# Patient Record
Sex: Female | Born: 1975 | Race: Black or African American | Hispanic: No | Marital: Married | Smoking: Never smoker
Health system: Southern US, Community
[De-identification: ages and names within clinical notes are randomized; demographics above are authoritative.]

## PROBLEM LIST (undated history)

## (undated) DIAGNOSIS — Z8742 Personal history of other diseases of the female genital tract: Secondary | ICD-10-CM

## (undated) DIAGNOSIS — B009 Herpesviral infection, unspecified: Secondary | ICD-10-CM

## (undated) DIAGNOSIS — K56609 Unspecified intestinal obstruction, unspecified as to partial versus complete obstruction: Secondary | ICD-10-CM

## (undated) DIAGNOSIS — D369 Benign neoplasm, unspecified site: Secondary | ICD-10-CM

## (undated) HISTORY — DX: Benign neoplasm, unspecified site: D36.9

## (undated) HISTORY — PX: OOPHORECTOMY: SHX86

## (undated) HISTORY — DX: Personal history of other diseases of the female genital tract: Z87.42

## (undated) HISTORY — PX: OVARIAN CYST SURGERY: SHX726

## (undated) HISTORY — DX: Unspecified intestinal obstruction, unspecified as to partial versus complete obstruction: K56.609

---

## 2001-03-08 ENCOUNTER — Other Ambulatory Visit: Admission: RE | Admit: 2001-03-08 | Discharge: 2001-03-08 | Payer: Self-pay | Admitting: *Deleted

## 2001-09-24 ENCOUNTER — Encounter: Admission: RE | Admit: 2001-09-24 | Discharge: 2001-09-24 | Payer: Self-pay | Admitting: Nephrology

## 2001-09-24 ENCOUNTER — Encounter: Payer: Self-pay | Admitting: Nephrology

## 2001-09-26 ENCOUNTER — Encounter: Admission: RE | Admit: 2001-09-26 | Discharge: 2001-09-26 | Payer: Self-pay | Admitting: Nephrology

## 2001-09-26 ENCOUNTER — Encounter: Payer: Self-pay | Admitting: Nephrology

## 2001-10-03 ENCOUNTER — Other Ambulatory Visit: Admission: RE | Admit: 2001-10-03 | Discharge: 2001-10-03 | Payer: Self-pay | Admitting: Gynecology

## 2001-10-30 ENCOUNTER — Encounter: Payer: Self-pay | Admitting: Gynecology

## 2001-11-06 ENCOUNTER — Inpatient Hospital Stay (HOSPITAL_COMMUNITY): Admission: RE | Admit: 2001-11-06 | Discharge: 2001-11-11 | Payer: Self-pay | Admitting: Gynecology

## 2001-11-06 ENCOUNTER — Encounter (INDEPENDENT_AMBULATORY_CARE_PROVIDER_SITE_OTHER): Payer: Self-pay | Admitting: *Deleted

## 2001-11-09 ENCOUNTER — Encounter: Payer: Self-pay | Admitting: Gynecology

## 2001-11-10 ENCOUNTER — Encounter: Payer: Self-pay | Admitting: Gynecology

## 2003-01-17 ENCOUNTER — Encounter: Payer: Self-pay | Admitting: Internal Medicine

## 2003-01-17 ENCOUNTER — Encounter: Admission: RE | Admit: 2003-01-17 | Discharge: 2003-01-17 | Payer: Self-pay | Admitting: Internal Medicine

## 2003-10-30 ENCOUNTER — Encounter: Admission: RE | Admit: 2003-10-30 | Discharge: 2003-12-30 | Payer: Self-pay | Admitting: Occupational Medicine

## 2004-12-28 ENCOUNTER — Ambulatory Visit (HOSPITAL_COMMUNITY): Admission: RE | Admit: 2004-12-28 | Discharge: 2004-12-28 | Payer: Self-pay | Admitting: Obstetrics and Gynecology

## 2005-04-15 ENCOUNTER — Ambulatory Visit: Payer: Self-pay | Admitting: Cardiology

## 2005-05-10 ENCOUNTER — Ambulatory Visit: Payer: Self-pay

## 2005-05-15 ENCOUNTER — Emergency Department (HOSPITAL_COMMUNITY): Admission: EM | Admit: 2005-05-15 | Discharge: 2005-05-16 | Payer: Self-pay | Admitting: *Deleted

## 2005-06-09 ENCOUNTER — Inpatient Hospital Stay (HOSPITAL_COMMUNITY): Admission: AD | Admit: 2005-06-09 | Discharge: 2005-06-11 | Payer: Self-pay | Admitting: Obstetrics and Gynecology

## 2005-06-23 ENCOUNTER — Inpatient Hospital Stay (HOSPITAL_COMMUNITY): Admission: AD | Admit: 2005-06-23 | Discharge: 2005-06-26 | Payer: Self-pay | Admitting: Obstetrics and Gynecology

## 2005-07-28 ENCOUNTER — Ambulatory Visit (HOSPITAL_COMMUNITY): Admission: RE | Admit: 2005-07-28 | Discharge: 2005-07-28 | Payer: Self-pay | Admitting: Obstetrics and Gynecology

## 2007-03-23 ENCOUNTER — Other Ambulatory Visit: Admission: RE | Admit: 2007-03-23 | Discharge: 2007-03-23 | Payer: Self-pay | Admitting: Internal Medicine

## 2008-10-17 ENCOUNTER — Emergency Department (HOSPITAL_BASED_OUTPATIENT_CLINIC_OR_DEPARTMENT_OTHER): Admission: EM | Admit: 2008-10-17 | Discharge: 2008-10-17 | Payer: Self-pay | Admitting: Emergency Medicine

## 2010-12-01 LAB — CBC
HCT: 38.6 % (ref 36.0–46.0)
Hemoglobin: 12.7 g/dL (ref 12.0–15.0)
MCH: 27.7 pg (ref 26.0–34.0)
MCHC: 32.9 g/dL (ref 30.0–36.0)
MCV: 84.1 fL (ref 78.0–100.0)
Platelets: 253 10*3/uL (ref 150–400)
RBC: 4.59 MIL/uL (ref 3.87–5.11)
RDW: 13.5 % (ref 11.5–15.5)
WBC: 5.6 10*3/uL (ref 4.0–10.5)

## 2010-12-08 ENCOUNTER — Ambulatory Visit (HOSPITAL_COMMUNITY)
Admission: RE | Admit: 2010-12-08 | Discharge: 2010-12-08 | Payer: Self-pay | Source: Home / Self Care | Attending: Obstetrics and Gynecology | Admitting: Obstetrics and Gynecology

## 2010-12-13 LAB — PREGNANCY, URINE: Preg Test, Ur: NEGATIVE

## 2010-12-23 LAB — SURGICAL PCR SCREEN
MRSA, PCR: NEGATIVE
Staphylococcus aureus: NEGATIVE

## 2010-12-23 NOTE — Op Note (Addendum)
[name], [name]            ACCOUNT NO.:  [id number]  MEDICAL RECORD NO.:  [id number]          PATIENT TYPE:  AMB  LOCATION:  SDC                           [facility]  [facility]  PHYSICIAN:  Hal Morales, M.D.DATE OF BIRTH:  1976/02/28  DATE OF PROCEDURE:  12/08/2010 DATE OF DISCHARGE:  12/08/2010                              OPERATIVE REPORT   PREOPERATIVE DIAGNOSES:  Enlarging right ovarian cyst status post left oophorectomy and right ovarian cystectomy for bilateral dermoids in 2002.  POSTOPERATIVE DIAGNOSES:  Enlarging right ovarian cyst status post left oophorectomy and right ovarian cystectomy for bilateral dermoids in 2002, right ovarian dermoid and right ovarian simple cyst.  PROCEDURE:  Laparoscopic right ovarian cystectomies.  SURGEON:  Miakoda Mcmillion P. Pennie Rushing, MD  FIRST ASSISTANT:  Janine Limbo, MD  ANESTHESIA:  General orotracheal.  ESTIMATED BLOOD LOSS:  Less than 50 mL.  COMPLICATIONS:  None.  FINDINGS:  The uterus was normal-sized.  The patient was status post left salpingo-oophorectomy.  The right ovary contained an 8 cm dermoid cyst with hair and bone and fat cells.  It also contained a 2 cm simple cyst.  The ovary did not have adhesions and the tube on the right side appeared normal.  PROCEDURE:  The patient was taken to the operating room after appropriate identification and placed on the operating table.  After the attainment of adequate general anesthesia, the patient was placed in a modified lithotomy position.  The abdomen, perineum and vagina were prepped with multiple layers of Betadine.  A Foley catheter was inserted into the bladder under sterile conditions and connected to straight drainage.  A Hulka tenaculum was placed on the anterior cervix.  The abdomen was draped as a sterile field.  Subumbilical and suprapubic injections of 0.25% Marcaine for a total of 10 mL was undertaken.  A subumbilical incision was made down to the fascia  which was held and marked with suture and the peritoneum entered.  A Hasson cannula was placed into the peritoneum and anchored with sutures that had been placed through the peritoneum.  The pneumoperitoneum was then created with 4 L of CO2.  The laparoscope was placed in the trocar sleeve. Suprapubic incisions were made to the right and left of midline with a 5- mm trocar being placed in the left incision under direct visualization and a 10-mm trocar being placed in the right incision under direct visualization.  The above-noted findings were made and documented.  The right ovary was elevated and the cortex incised.  Hydrodissection was used to allow separation of the ovarian cyst from the overlying cortex. Once the ovarian cyst had been three-quarters freed from the ovarian cortex, there was a rupture of the cyst with the egress of fatty fluid. The remainder of the cyst was then removed with a combination of blunt and sharp dissection and placed in an EndoCatch bag, then removed through the right suprapubic 10-mm port.  The site of the second cyst was then incised in the cortex and the cortex peeled off the underlying cyst.  That cyst was removed from the ovarian cortex and then from the operative field through the  5 mm port after rupture of the cyst with egress of clear fluid.  Copious irrigation was carried out for a total of 3 L.  A small amount of warm lactated Ringer was left in the pelvis. Hemostasis in the right ovary was noted to be adequate.  All instruments were then removed from the peritoneal cavity under direct visualization as a CO2 was allowed to escape.  The subumbilical incision was closed with the anchoring fascial sutures of 0 Vicryl in a figure-of-eight fashion.  The right suprapubic incision was closed with 2 figure-of- eight sutures.  All of the skin incisions were closed with subcuticular sutures of 3-0 Vicryl.  Sterile dressings were applied, the Hulka tenaculum  removed, and the Foley catheter removed.  The patient was awakened from general anesthesia and taken to the recovery room in satisfactory condition having tolerated the procedure well with sponge and instrument counts correct.  SPECIMENS TO PATHOLOGY:  Two cysts from the right ovary, one a probable dermoid, the second a simple cyst.  DISCHARGE INSTRUCTIONS:  Printed instructions for laparoscopy from the [facility].  FOLLOWUP:  The patient will follow up in 2 weeks with Dr. Pennie Rushing.  DISCHARGE MEDICATIONS: 1. Ibuprofen 600 mg p.o. q.6 h. for 3 days and then q.6 h. p.r.n.     pain. 2. Vicodin 5/500 mg 1-2 p.o. q.4 h. p.r.n. severe pain.     Hal Morales, M.D.     VPH/MEDQ  D:  12/08/2010  T:  12/09/2010  Job:  [id number]  Electronically Signed by Dierdre Forth M.D. on 12/23/2010 03:22:58 PM

## 2010-12-23 NOTE — H&P (Addendum)
NAMEJOLINE, [name]            ACCOUNT NO.:  [id number]  MEDICAL RECORD NO.:  [id number]         PATIENT TYPE:  WAMB  LOCATION:                                [facility]  [facility]  PHYSICIAN:  Hal Morales, M.D.DATE OF BIRTH:  Jun 01, 1976  DATE OF ADMISSION:  12/08/2010 DATE OF DISCHARGE:                             HISTORY & PHYSICAL   DATE OF ADMISSION:  December 08, 2010.  DATE OF EXAMINATION:  November 24, 2010.  HISTORY OF PRESENT ILLNESS:  The patient is a 35 year old black female para 1-0-0-1 who presents for management of an enlarging right ovarian cyst.  The patient was initially seen for this concern in July 2011 at which time she was undergoing her routine gynecologic evaluation for her annual examination but complained of several months of dull right lower quadrant pain, not requiring any medication.  She also complained of positional dyspareunia.  On evaluation, she had an ultrasound which showed a right ovarian simple cyst measuring 6.5 cm in its largest dimension and a right ovarian complex cyst appearing hemorrhagic measuring 2.2 cm in its largest dimension.  The left ovary was surgically removed having been removed in 2001 because of a dermoid cyst.  The uterus was normal sized.  The patient was followed with a repeat ultrasound approximately 6 weeks later showing a thinly septated cyst measuring 8.3 cm in largest dimension and the complex hemorrhagic portion being 2.4 cm.  Neither cyst had increased cul-de-sac fluid.  The patient at that time underwent a CA-125 which was 12.3, i.e., within the normal range.  At this time, she was completely asymptomatic and the decision was made to give the cyst an additional 6 weeks for total of 3 months to decrease in size or resolve.  She returned on November 24, 2010, and at that time was noted to have an ovarian cyst on the right now measuring 9.4 cm in its largest dimension with was thought to be a solid component of 3.3  cm and no cul-de-sac fluid.  This was thought to be possibly consistent with a right dermoid cyst and recommendation was made for removal.  The patient continues to deny any significant abdominal pain.  She likewise denies nausea, vomiting, or diarrhea.  She has had some recent constipation.  She denies dysuria.  She is having regular menstrual periods the last of which was on November 01, 2010, lasting 3 days with no intermenstrual bleeding and no cramps.  She is using condoms for contraception.  PAST MEDICAL HISTORY:  Obstetrical:  The patient had a vaginal delivery in 2006 that was complicated by postpartum preeclampsia requiring rehospitalization for approximately 7 days.  Since that time, she has had normal blood pressures.  SURGICAL HISTORY:  The patient had a laparotomy in 2001 with a left oophorectomy for dermoid cyst.  CURRENT MEDICATIONS:  None.  ALLERGIES:  Drug sensitivities none known.  FAMILY HISTORY:  Positive for hypertension in maternal grandmother and her father and diabetes mellitus type 2 in a maternal grandmother.  SOCIAL HISTORY:  The patient works as an Production designer, theatre/television/film at a [employer].  She is married.  She does not smoke  cigarettes and does not use alcohol.  REVIEW OF SYSTEMS:  Otherwise negative.  PHYSICAL EXAMINATION:  GENERAL:  The patient is a well-developed black female in no acute distress. VITAL SIGNS:  Pulse 68, blood pressure 120/82, weight 176 pounds. LUNGS:  Clear. HEART:  Regular rate rhythm. ABDOMEN:  Soft without masses or organomegaly. PELVIC:  EGBUS within normal limits.  Vagina is rugous.  The cervix is without gross lesions.  The uterus is normal size, shape, consistency, anterior, mobile, and nontender.  On the right, there is a palpable mildly tender right adnexal mass which is cystic in nature. RECTOVAGINAL:  Deferred.  IMPRESSION:  Enlarging right ovarian cyst, probably recurrent dermoid. The patient is status post left  oophorectomy and right ovarian cystectomy for bilateral dermoid cyst in 2001.  DISPOSITION:  Discussion was held with the patient concerning the recommended laparoscopy for right ovarian cystectomy.  She understands that a right oophorectomy could be needed, however, this will be avoided if at all possible.  She also understands that laparotomy could be required.  The risks of anesthesia, bleeding, infection, and damage to adjacent organs have been reviewed and the patient wishes to proceed at [facility] on December 08, 2010.     Hal Morales, M.D.     VPH/MEDQ  D:  12/06/2010  T:  12/07/2010  Job:  [id number]  Electronically Signed by Dierdre Forth M.D. on 12/23/2010 03:20:38 PM

## 2011-04-15 NOTE — H&P (Signed)
NAMEMARLEAH, [name]            ACCOUNT NO.:  [id number]   MEDICAL RECORD NO.:  [id number]          PATIENT TYPE:  INP   LOCATION:  9372                          [facility]ILITY:  [facility]   PHYSICIAN:  Osborn Coho, M.D.   DATE OF BIRTH:  [birth year]   DATE OF ADMISSION:  06/23/2005  DATE OF DISCHARGE:                                HISTORY & PHYSICAL   This is a 35 year old gravida 1, para 1-0-0-1, who is 2 weeks postpartum,  presents for evaluation of headache and hypertension.  She reports having  headache since July 15 and blurred vision since 5 months of pregnancy.  She  has no history of hypertension prior to or during the pregnancy or delivery  time.  Her history is remarkable for (1) History of dermoid cyst.  (2)  History of pyelonephritis.  (3) Keloids.   OBSTETRICAL HISTORY:  The patient had a vaginal delivery on June 09, 2005,  of a female infant, weighing 9 pounds 3 ounces, Apgars 9 and 9, remarkable for  a partial 3rd degree laceration.  There were no complications during or  after delivery.   MEDICAL HISTORY:  1.  Remarkable for a dermoid cyst removed from each ovary.  2.  She has a history of childhood Varicella.  3.  She has a history of acid reflux and pyelonephritis back in the fall.   SURGICAL HISTORY:  Remarkable for removal of dermoid cyst in 2002 from each  ovary, and the patient had an ileus following that surgery.   FAMILY HISTORY:  Remarkable for a grandfather with diabetes and Alzheimer's  and an uncle with an unknown type of psychiatric disease and a mother with  nicotine use.   GENETIC HISTORY:  Remarkable for a cousin with cerebral palsy.   SOCIAL HISTORY:  The patient is married to Zachery Dakins, who is involved  and supportive.  She works as a Engineer, civil (consulting).  She denies any alcohol, tobacco, or  drug use.  She is of the [place] faith.   HISTORY OF CURRENT PREGNANCY:  The patient entered care at [redacted] weeks  gestation.  She had an ultrasound at that time  showing normal ovaries and  intrauterine pregnancy.  She had some spotting at 13 weeks which resolved.  She had an ultrasound at 18 weeks which was normal.  She declined her quad  screen.  She had a Glucola done at 28 weeks which was normal.  She had some  palpitations around that time and saw a cardiologist who deemed her to be  normal.  She measured size greater than dates at 36 weeks, and an ultrasound  was done showing 75-90th percentile.  She had a reactive NST prior to  delivery and then had a vaginal delivery as noted above.   PRENATAL LABS:  Hemoglobin 12.2, platelets 233, blood type A positive,  antibody screen negative, sickle cell negative, RPR nonreactive, rubella  equivocal, hepatitis negative, HIV negative, Pap normal, gonorrhea negative,  Chlamydia negative, cystic fibrosis negative, Glucola normal.   OBJECTIVE DATA:  VITAL SIGNS:  Stable.  Blood pressure 140/102, 154/98,  182/109, 181/113, and 165/90.  HEENT:  Within normal limits with slight facial edema.  Thyroid normal and  not enlarged.  CHEST:  Clear to auscultation.  HEART:  Regular rate and rhythm.  ABDOMEN:  Soft and nontender.  Lochia normal and scant.  EXTREMITIES:  Trace to 1+ edema.   Cath UA shows specific gravity of 1.010 and all other parameters are  negative including protein.  Uric acid 5.6, hemoglobin 12.1, platelets 387,  creatinine 0.9, AST 25, ALT 31, LDH 226.   ASSESSMENT:  1.  Status post spontaneous vaginal delivery 2 weeks ago.  2.  Postpartum pregnancy-induced hypertension, rule out preeclampsia.   PLAN:  1.  Admit to hospital per Dr. Su Hilt.  2.  Labetalol for hypertension control.  3.  Magnesium sulfate administration.  4.  Twenty-four urine for protein and creatinine clearance.  5.  Medical doctor to follow.       MLW/MEDQ  D:  06/23/2005  T:  06/23/2005  Job:  [id number]

## 2011-04-15 NOTE — H&P (Signed)
[name], [name]            ACCOUNT NO.:  [id number]   MEDICAL RECORD NO.:  [id number]          PATIENT TYPE:  INP   LOCATION:  NA                            [facility]   PHYSICIAN:  Hal Morales, M.D.DATE OF BIRTH:  [birth year]   DATE OF ADMISSION:  DATE OF DISCHARGE:                                HISTORY & PHYSICAL   This is a 35 year old gravida 1, para 0-0-0-0, at 41-0/7 weeks, who presents  to the office with complaints of contractions every five to nine minutes for  one hour.  Cervix is 4-5 cm dilated, and she will be going to the hospital  for admission.  She reports positive fetal movement and bloody show and  denies leaking.  Pregnancy has been followed by the nurse midwife service  and remarkable for:   1.  History of dermoid cyst.  2.  Hysterectomy of pyelonephritis.  3.  Keloid scarring.  4.  Group B strep negative.   PAST OBSTETRICAL HISTORY:  The patient is a primigravida.   PAST MEDICAL HISTORY:  Remarkable for a dermoid cyst removed from each  ovary.  She has a history of childhood varicella.  She has a history of acid  reflux and a history of pyelonephritis in the fall.   PAST SURGICAL HISTORY:  Remarkable for removal of dermoid cysts in 2002 from  each ovary.  The patient had an ileus following the surgery.   FAMILY HISTORY:  Remarkable for a grandmother with diabetes and Alzheimer's,  an uncle with unknown type of psychiatric disease, mother with nicotine use.   GENETIC HISTORY:  Remarkable for a cousin with cerebral palsy.   SOCIAL HISTORY:  The patient is married to Zachery Dakins, who is involved  and supportive.  She works as a Engineer, civil (consulting).  She denies any alcohol, tobacco or  drug use.  She is of the [place] faith.   HISTORY OF CURRENT PREGNANCY:  The patient entered care at 23 weeks'  gestation.  She had an ultrasound at that time showing normal ovaries and an  intrauterine pregnancy.  She had some spotting at 13 weeks, which  resolved.  She had an ultrasound at 18 weeks, which was normal.  She declined her quad  screen.  She had a Glucola done at 28 weeks, which was normal.  She had some  palpitations around that time and saw a cardiologist, who deemed her to be  normal.  She measured size greater than dates at 36 weeks and ultrasound was  done, which showed 75-90th percentile growth, AFI of 21.1.  She had a  reactive NST yesterday.  Cervix yesterday was fingertip and 50% and today is  4-5 cm.   PRENATAL LABORATORY DATA:  Hemoglobin 12.2, platelets 233.  Blood type A  positive, antibody screen negative.  Sickle cell negative.  RPR nonreactive.  Rubella equivocal.  Hepatitis negative.  HIV negative.  Pap normal.  Gonorrhea negative, Chlamydia negative.  Cystic fibrosis negative.  Glucola  within normal limits.   OBJECTIVE:  VITAL SIGNS:  Stable, afebrile.  HEENT:  Within normal limits.  NECK:  Thyroid normal, not  enlarged.  CHEST:  Clear to auscultation.  CARDIAC:  Regular rate and rhythm.  ABDOMEN:  Gravid at 40 cm, vertex to Leopold's.  Fetal heart rate 150.  PELVIC:  Cervix is 4-5, 90% effaced, -1 to  station, vertex presentation,  intact membranes.  Positive bloody show.  EXTREMITIES:  Within normal limits.   ASSESSMENT:  1.  Intrauterine pregnancy at 41 weeks.  2.  Early active labor.  3.  Group B strep negative.   PLAN:  1.  Admit to birthing suite, Dr. Pennie Rushing will be notified.  Renaldo Reel Emilee Hero,      C.N.M., has been notified.  2.  Routine C.N.M. orders.  3.  Further orders to follow.       MLW/MEDQ  D:  06/09/2005  T:  06/09/2005  Job:  [id number]

## 2011-04-15 NOTE — Discharge Summary (Signed)
[name], [name]            ACCOUNT NO.:  [id number]   MEDICAL RECORD NO.:  [id number]          PATIENT TYPE:  INP   LOCATION:  9319                          [facility]   PHYSICIAN:  Janine Limbo, M.D.DATE OF BIRTH:  08/21/1976   DATE OF ADMISSION:  06/23/2005  DATE OF DISCHARGE:  06/26/2005                                 DISCHARGE SUMMARY   ADMITTING DIAGNOSES:  1.  Two weeks status post vaginal delivery.  2.  Severe headache.  3.  Pregnancy-induced hypertension.  4.  Rule out preeclampsia.   DISCHARGE DIAGNOSIS:  Postpartum preeclampsia, status post magnesium sulfate  now stable.   HISTORY:  [name] a 34 year old gravida 1, para 1, who presented to the  office of [facility] 2 weeks postpartum for evaluation of headache and  hypertension.   HOSPITAL COURSE:  On admission to [facility], her blood pressures were  140/102, 154/98 to 181/113. She was, therefore, admitted for magnesium  sulfate and 24-hour urine.  Her blood work was normal. CBC showed WBC 7.9,  hemoglobin 12.1, hematocrit 37.2, and platelets 386,000. Uric acid 5.6, AST  25, ALT 31, LDH 226.  Her metabolic panel was within normal limits. She was  begun on Labetalol, and her blood pressures stabilized.  Her 24-hour urine  returned with 360 grams of protein for a diagnosis of preeclampsia. She has  felt much better, and her headache has resolved.  She has been off magnesium  sulfate for 24 hours. Her blood pressures have stabilized, although on the  high side. They have been 154/95, 133/84, 147/93.  Per Dr. Stefano Gaul, she is  in stable condition for discharge. She has been taking labetalol 100  milligrams b.i.d.   DISCHARGE MEDICATIONS:  She will be discharged home today with labetalol 200  milligrams p.o. b.i.d.   FOLLOW UP:  She will be for a blood pressure check this week at home and  follow up in the office as needed. She will call for any PIH symptoms or any  problems or  concerns.       SDM/MEDQ  D:  06/26/2005  T:  06/26/2005  Job:  [id number]

## 2011-04-15 NOTE — Discharge Summary (Signed)
[facility]  Patient:    [name], [name] Visit Number: [id number] MRN: [id number]          Service Type: GYN Location: 4W [id number] [id number] Attending Physician:  Tonye Royalty Dictated by:   Gaetano Hawthorne. Lily Peer, M.D. Admit Date:  11/06/2001 Disc. Date: 11/11/01                             Discharge Summary  HISTORY OF PRESENT ILLNESS:  The patient is a 35 year old, gravida 0, who on the afternoon of November 06, 2001, underwent an exploratory laparotomy,  left salpingo-oophorectomy, and right ovarian cystectomy with no intraoperative complications and the blood loss had been reported to be 200 cc.  HOSPITAL COURSE:  On the patients first postoperative day, her hemoglobin and hematocrit were 11.6 and 37.1, respectively, with a platelet count of 267,000. She had minimal bowel sounds and her incision was intact for the first 24 hours.  Her Foley catheter was removed, as well as her PCP pump and she was started on clear liquids that evening.  On the second postoperative day, there was no flatus reported, her vital signs were stable, and her abdomen appeared mildly distended, questionable ileus versus early small bowel obstruction. The incision was intact.  She was given a Dulcolax suppository and was kept on clear liquids.  She also was given magnesium citrate to take as well.  On the third postoperative day, still no flatus and minimal bowel sounds were noted. She was kept on clear liquids.  A flat and upright of the abdomen was ordered that evening and postoperative ileus was the working diagnosis as reported by the radiologist.  Her electrolytes demonstrated some mild hypokalemia with a potassium of 3.3.  She had an NG tube placed and was given 30 mEq of KCl per liter of D5 LR.  She was kept on the NG tube for 24 hours.  On the evening of November 10, 2001, her flat and upright of the abdomen findings were consistent with a mild postoperative  ileus and the NG tube was removed.  Her electrolytes had returned back to normal.  She was started on clear liquids that night.  She eventually had three bowel movements on the evening of November 11, 2001, tolerated clear liquids, and was ready to be discharged home.  On the day of discharge, her vital signs were stable, she had three bowel movements, had passed flat, tolerated a liquids diet, bowel sounds were present, and the incision site was intact.  PROCEDURES PERFORMED: 1. Exploratory laparotomy. 2. Left salpingo-oophorectomy. 3. Right ovarian cystectomy. 4. Pelvic washings/frozen specimen.  FINAL DIAGNOSES: 1. Left benign ovarian cystic teratoma. 2. Right benign cystic teratoma. 3. Postoperative ileus.  FINAL DISPOSITION:  The patient was discharged home on her fifth postoperative day.  She had tolerated her diet, passed flatus, had severe bowel movements, and had been ambulating.  All instructions were provided to the patient in written and oral form and all questions were answered.  FOLLOW-UP:  She is to return to the office in 48 hours to have her staples removed and for follow-up.  DISCHARGE MEDICATIONS:  She was instructed to stay on Motrin 800 mg t.i.d. for the next several days before taking any narcotics to continue to improve her peristalsis due to the fact that she had postoperative ileus.  She also was given a prescription for Lortab 7.5 mg to take one p.o. q.4-6h. p.r.n.  ACTIVITY:  Instructions as to no lifting or strenuous activity were provided to the patient. Dictated by:   Gaetano Hawthorne Lily Peer, M.D. Attending Physician:  Tonye Royalty DD:  11/11/01 TD:  11/11/01 Job: [id number]

## 2011-04-15 NOTE — H&P (Signed)
[facility]  Patient:    [name], [name] Visit Number: [id number] MRN: [id number]          Service Type: Attending:  Gaetano Hawthorne. Lily Peer, M.D. Dictated by:   Gaetano Hawthorne Lily Peer, M.D. Adm. Date:  11/06/01                           History and Physical  CHIEF COMPLAINT:  Bilateral pelvic masses.  HISTORY OF PRESENT ILLNESS:  The patient is a 35 year old, G0 who was seen in the office for her annual gynecological examination as a new patient on October 13, 2001.  She had brought with her reports of a CT that she had done recently due to the fact that she had been complaining of left leg pain and numbness for several years.  Incidental finding on the CT scan demonstrated pelvic mass on her left ovary measuring 14 x 8.8 cm.  As part of the preop workup, she had a CBC, UA, GC and chlamydia, cholesterol, CA 125, HCG and AST with tumor markers as well as an ultrasound.  The ultrasound in the office demonstrated a displaced uterus by the left adnexal mass.  The uterus measures 7.8 x 3.5 x 5.4 cm with an endometrial stripe of 13.2 mm.  Cystic fluid area was noted in the uterine fundus consistent with gestational sac less than five weeks measuring 6.1 mm.  The right ovary seemed superior to the uterus and measured 4.6 x 2.7 x 5.0 cm with a complex cystic mass with a 14 x 12 mm echogenic focus.  Within the mass, was a cyst measuring 27 x 21 x 25 mm.  The left ovary was not seen.  Transabdominal image done secondary to massive size in the left adnexal mass.  The mass measured 15.3 x 9.5 x 12.7 cm, thin walled with diffuse homogeneous internal low level echocardiograms on transvaginal image.  Mass contained thin septations with layering of echogenic components in calcifications seen within this mass.  No vascular flow was seen within the mass with color flow Doppler.  On transvaginal image, mass appeared to be cystic solid, echogenic pattern with an echogenic  interface between these two echogenic patterns, possibly a dermoid cyst.  The patient subsequently decided to proceed and have an elective termination which she had on Monday, December 2.  Her workup demonstrated a urine that was normal.  Chest x-ray with no evidence of active disease.  Review of the CT scan that she had had on October 30, did not demonstrate any evidence of any pelvic or periaortic lymphadenopathy.  Consensus was possibly a dermoid cyst.  Her tumor markers were otherwise normal.  Her Pap smear recently was normal with the exception of yeast infection.  Her cholesterol was normal.  Her CBC was also normal.  GC and chlamydia culture were also negative.  The patient was seen today, December 6, for preoperative consultation.  PAST MEDICAL HISTORY:  Menarche at the age of 55.  She stated that her last menstrual period had been October 12, which would have placed her approximately 7-1/2 weeks at the time she had her elective termination on December 2.  She had been using condoms as a form of contraception.  She has had one steady partner for three years.  ALLERGIES:  No known drug allergies.  SOCIAL HISTORY:  No smoking or alcohol consumption.  FAMILY HISTORY:  Maternal grandmother is an insulin-dependent diabetic. Father with history of hypertension.  She  has one brother and one sister with normal health.  PHYSICAL EXAMINATION:  GENERAL:  Well-developed, well-nourished female.  HEENT:  Unremarkable.  NECK:  Supple.  Trachea midline.  No carotid bruits, no thyromegaly.  LUNGS:  Clear to auscultation without rhonchi or wheezes.  HEART:  Regular rate and rhythm with no murmurs, rubs or gallops.  BREASTS:  Not done.  ABDOMEN:  Slightly distended from pelvic mass that was palpated on exam.  No rebound or guarding.  PELVIC:  Bartholin, urethral and Skeene glands within normal limits.  Vagina and cervix with no gross lesions on inspection.  Uterus 18-week size  on bimanual exam.  Adnexa difficult to discern secondary to the large uterine mass.  Rectal exam and digital exam confirmatory.  ASSESSMENT:  A 35 year old, G1, P0, AB1, status post dilatation and evacuation with elective termination with incidental finding at time of a computed tomographic scan of abdominal and pelvic region secondary to the patient having had history of left leg pain and numbness.  She was found to have a large 16 cm size left adnexal mass and a right adnexal mass on the right ovary, possibly consistent with bilateral dermoid cyst.  The patient was counseled as to the plan of action while her tumor markers are negative with the exception of slightly elevated CA 125 at 33 and the HCG which could be explained due to the fact that she was recently pregnant.  The approach would be with a midline incision to allow adequate access to this large pelvic mass. Potential risks are that this mass could be malignant which would require extending the incision up to the xiphoid process.  Also, we will have the gynecologic oncologist on standby in the event we need to do a full staging procedure which the patient is aware could include a total abdominal hysterectomy with bilateral salpingo-oophorectomy, pelvic and periaortic lymphadenectomy along with partial omentectomy and this would mean that she would not be able to have anymore children.  Also in the event that these are dermoid cysts, all efforts will be made to perform bilateral cystectomy, but in the event that the left tube and ovary is are severely damaged, she has given full authorization to proceed with a left salpingo-oophorectomy with conservation of the right ovary after right ovarian cystectomy.  Other potential risks are infection, although she will receive prophylaxis antibiotics.  The risk also for a deep venous thrombosis and pulmonary embolism.  Also, there is potential risk for uncontrollable hemorrhage requiring  blood transfusion with potential risk such as anaphylactic reaction, hepatitis and AIDS or as a life saving measure of a total hysterectomy.  Again, the patient would not be able to have anymore children if there were to have to be done only as a life threatening measure.  Also, other risks to include trauma to internal organs with need for corrective surgery.  All these issues were discussed with the patient and husband and all questions were answered.  We will follow accordingly.  PLAN:  The patient is scheduled for exploratory laparotomy with bilateral ovarian cystectomy, possible left oophorectomy, possible left salpingo-oophorectomy with right cystectomy, possible TAH/BSO with staging procedure for malignancy if identified.  The patient is scheduled for surgery on Thursday, December 10, at 2 p.m. at [facility]. Dictated by:   Gaetano Hawthorne. Lily Peer, M.D. Attending:  Gaetano Hawthorne. Lily Peer, M.D. DD:  11/02/01 TD:  11/02/01 Job: [id number]

## 2011-04-15 NOTE — Op Note (Signed)
[facility]  Patient:    [name], [name] Visit Number: [id number] MRN: [id number]          Service Type: GYN Location: 4W 0473 01 Attending Physician:  Tonye Royalty Dictated by:   Gaetano Hawthorne. Lily Peer, M.D. Proc. Date: 11/06/01 Admit Date:  11/06/2001                             Operative Report  SURGEON:  Juan H. Lily Peer, M.D.  ASSISTANT:  Douglass Rivers, M.D.  INDICATIONS:  This is a 35 year old, gravida 1, para 0, AB 1, with bilateral pelvic masses. The patients workup had consisted of a slightly elevated CA-125 at 33, alpha-fetoprotein was normal, hCG had been positive due the fact that she recently had found out that she was pregnancy and had an elective termination approximately ten days ago. Chest x-ray was normal and abdominopelvic CT scan did not demonstrate any periaortic or pelvic lymphadenopathy.  PREOPERATIVE DIAGNOSIS:  Bilateral pelvic masses on left greater than right.  POSTOPERATIVE DIAGNOSIS:  Bilateral dermoid cysts (mature teratomas).  ANESTHESIA:  General endotracheal.  PROCEDURE: 1. Exploratory laparotomy. 2. Left salpingo-oophorectomy. 3. Right ovarian cystectomy.  FINDINGS:  The patient had a 15 x 10-cm left ovarian mass with smooth surface, no excrescences noted, impacted in the cul-de-sac. Right ovary had a 3 x 4-cm multilobulated ovary with ovarian cyst. Normal appearing appendix. There was no evidence of endometriosis or any pelvic adhesions with the exception of a small fibrinous adhesive band from the sigmoid colon to the left adnexal mass.  DESCRIPTION OF PROCEDURE:  After the patient was adequately counseled, she was taken to the operating room where she underwent a successful general endotracheal anesthesia. In an effort to prevent deep venous thrombosis, she had pneumatic compression stockings and to prevent infection intraoperatively, she received a gram of Cefotan IV. After anesthesia  was on board, the patient was in the supine position, the abdomen was prepped and draped in the usual sterile fashion, a Foley catheter was inserted in an effort to monitor urinary output. After the drapes were in placed, a midline incision was made from the infraumbilical region to the level of the symphysis pubis. The incision was carried down from the skin, subcutaneous tissue down to the rectus fascia. A midline nick was made, the fascia was incised in the cephalic and caudal fashion, the midline raphe was entered, and the peritoneal cavity was entered cautiously. Pelvic washings were obtained. Manual inspection of the mass demonstrated the large mass on the left adnexa wedged in the cul-de-sac and a smaller right ovarian cyst was noted as well. In an effort to gain exposure and eliminate the possibility of the mass rupturing and causing intraperitoneal spill, a 14-gauge needle with a 60-cc syringe was inserted into the cyst itself in an effort to retrieve some of the fluid and decompress it. Approximately 80 cc of mucinous-type fluid was obtained and a Kelly clamp was placed in the defect area to avoid any potential spillage into the abdominal cavity. The mass was able to be retrieved after OConnor-OSullivan retractors were in place. In a meticulous fashion, the left round ligament was identified as well as the left infundibulopelvic ligament as well as the course of the ureter. The tubo-ovarian ligament was clamped and cut and with progressive clamping, along the edge of the mass to incorporate the infundibulopelvic ligament. The mass was passed off in total to include the mass and the tube  for frozen section. As the frozen section report from pathologist was being waited for, the infundibulopelvic ligament was free tied with 0 Vicryl suture followed by a transfixation stitch of similar suture material. The tubo-ovarian ligament in the mesosalpinx was reapproximated with 0 Vicryl  suture as well.  At this point, attention was placed to the right ovary. Confirmation from the pathologist during the case had stated that on the frozen that this appeared to be a dermoid cyst with no immature elements seen, more consistent with a mature teratoma. We proceeded on with right ovarian cystectomy and a semi-elliptical incision was made on the cortex of the right ovary and multilobulated cyst was extruded and passed off the operative field for frozen section as well. This also came back as well as mature teratoma. The cyst was reapproximated with an imbricating stitch of 3-0 Vicryl suture in a two-layered fashion. Once this was completed, the pelvic-abdominal cavity was copiously irrigated with warm normal saline solution, and a systematic inspection of the pelvic cavity demonstrated adequate hemostasis. The left infundibulopelvic ligament was identified, sutures were in place, no bleeding was noted and the retroperitoneal space was entered in an effort to identify the course of the ureter to make sure that there was no kinking due to the fact that there was an adhesive band from the sigmoid colon to this mass near the course of the ureter. The ureter was identified and had good peristaltic action. The retroperitoneal space was then closed with a running stitch of 3-0 Vicryl suture. The pelvic cavity was once again irrigated with normal saline solution and in an effort to prevent postoperative adhesions, Interceed was placed on both pelvic sidewalls. Once this was accomplished, the OConnor-OSullivan retractor was removed, sponge count and needle counts were correct, and the visceral peritoneum was incorporated as part of the closure along with the rectus fascia in a running stitch manner with double-stranded 1-0 Ethibond in a locking stitch manner. Once this was completed, the subcutaneous bleeders were Bovie cauterized. This area was irrigated once again and the skin was  reapproximated with skin clips followed by placement of Xeroform gauze and 4 x 8 dressing. The patient was extubated, transferred to  the recovery room with stable vital signs. Blood loss was 200 cc, urine output was 900 cc and clear, and intravenous fluids was 2700 cc of lactated Ringers. The patient received 30 mg of Toradol in route to the recovery room and she will be placed on Dilaudid PCA for postoperative analgesia. Dictated by:   Gaetano Hawthorne Lily Peer, M.D. Attending Physician:  Tonye Royalty DD:  11/06/01 TD:  11/07/01 Job: [id number]

## 2011-08-31 LAB — DIFFERENTIAL
Basophils Absolute: 0
Basophils Relative: 1
Eosinophils Absolute: 0.2
Eosinophils Relative: 2
Lymphocytes Relative: 30
Lymphs Abs: 2.2
Monocytes Absolute: 0.4
Monocytes Relative: 5
Neutro Abs: 4.5
Neutrophils Relative %: 63

## 2011-08-31 LAB — URINALYSIS, ROUTINE W REFLEX MICROSCOPIC
Bilirubin Urine: NEGATIVE
Glucose, UA: NEGATIVE
Hgb urine dipstick: NEGATIVE
Ketones, ur: NEGATIVE
Nitrite: NEGATIVE
Protein, ur: NEGATIVE
Specific Gravity, Urine: 1.034 — ABNORMAL HIGH
Urobilinogen, UA: 1
pH: 6.5

## 2011-08-31 LAB — COMPREHENSIVE METABOLIC PANEL
ALT: 23
AST: 30
Albumin: 4.8
Alkaline Phosphatase: 70
BUN: 20
CO2: 27
Calcium: 10.1
Chloride: 102
Creatinine, Ser: 0.9
GFR calc Af Amer: >60
GFR calc non Af Amer: >60
Glucose, Bld: 87
Potassium: 3.9
Sodium: 140
Total Bilirubin: 0.7
Total Protein: 8.5 — ABNORMAL HIGH

## 2011-08-31 LAB — URINE MICROSCOPIC-ADD ON

## 2011-08-31 LAB — LIPASE, BLOOD: Lipase: 140

## 2011-08-31 LAB — CBC
HCT: 39.5
Hemoglobin: 13.2
MCHC: 33.4
MCV: 83.2
Platelets: 241
RBC: 4.75
RDW: 12.7
WBC: 7.3

## 2011-08-31 LAB — PREGNANCY, URINE: Preg Test, Ur: NEGATIVE

## 2012-01-29 ENCOUNTER — Emergency Department (HOSPITAL_COMMUNITY): Payer: PRIVATE HEALTH INSURANCE

## 2012-01-29 ENCOUNTER — Emergency Department (HOSPITAL_COMMUNITY)
Admission: EM | Admit: 2012-01-29 | Discharge: 2012-01-29 | Disposition: A | Discharge status: Home / Self Care | Payer: PRIVATE HEALTH INSURANCE | Attending: Emergency Medicine | Admitting: Emergency Medicine

## 2012-01-29 ENCOUNTER — Inpatient Hospital Stay (HOSPITAL_COMMUNITY)
Admission: EM | Admit: 2012-01-29 | Discharge: 2012-02-02 | DRG: 390 | Disposition: A | Discharge status: Home / Self Care | Payer: PRIVATE HEALTH INSURANCE | Attending: General Surgery | Admitting: General Surgery

## 2012-01-29 ENCOUNTER — Encounter (HOSPITAL_COMMUNITY): Payer: Self-pay | Admitting: *Deleted

## 2012-01-29 ENCOUNTER — Encounter (HOSPITAL_COMMUNITY): Payer: Self-pay

## 2012-01-29 DIAGNOSIS — K56609 Unspecified intestinal obstruction, unspecified as to partial versus complete obstruction: Principal | ICD-10-CM | POA: Diagnosis present

## 2012-01-29 DIAGNOSIS — N390 Urinary tract infection, site not specified: Secondary | ICD-10-CM | POA: Insufficient documentation

## 2012-01-29 DIAGNOSIS — K565 Intestinal adhesions [bands], unspecified as to partial versus complete obstruction: Secondary | ICD-10-CM

## 2012-01-29 DIAGNOSIS — R112 Nausea with vomiting, unspecified: Secondary | ICD-10-CM | POA: Insufficient documentation

## 2012-01-29 DIAGNOSIS — N8300 Follicular cyst of ovary, unspecified side: Secondary | ICD-10-CM | POA: Diagnosis present

## 2012-01-29 DIAGNOSIS — R197 Diarrhea, unspecified: Secondary | ICD-10-CM | POA: Insufficient documentation

## 2012-01-29 DIAGNOSIS — R109 Unspecified abdominal pain: Secondary | ICD-10-CM | POA: Insufficient documentation

## 2012-01-29 LAB — COMPREHENSIVE METABOLIC PANEL WITH GFR
ALT: 13 U/L (ref 0–35)
AST: 16 U/L (ref 0–37)
Albumin: 4.4 g/dL (ref 3.5–5.2)
Alkaline Phosphatase: 66 U/L (ref 39–117)
Chloride: 101 meq/L (ref 96–112)
Potassium: 3.8 meq/L (ref 3.5–5.1)
Sodium: 137 meq/L (ref 135–145)
Total Bilirubin: 0.7 mg/dL (ref 0.3–1.2)

## 2012-01-29 LAB — URINALYSIS, ROUTINE W REFLEX MICROSCOPIC
Glucose, UA: NEGATIVE mg/dL
Ketones, ur: >80 mg/dL — AB
Nitrite: NEGATIVE
Specific Gravity, Urine: 1.02 (ref 1.005–1.030)
Urobilinogen, UA: 1 mg/dL (ref 0.0–1.0)
pH: 6.5 (ref 5.0–8.0)

## 2012-01-29 LAB — COMPREHENSIVE METABOLIC PANEL
BUN: 14 mg/dL (ref 6–23)
CO2: 24 mEq/L (ref 19–32)
Calcium: 10.4 mg/dL (ref 8.4–10.5)
Creatinine, Ser: 0.85 mg/dL (ref 0.50–1.10)
GFR calc Af Amer: >90 mL/min (ref >90)
GFR calc non Af Amer: 86 mL/min — ABNORMAL LOW (ref >90)
Glucose, Bld: 80 mg/dL (ref 70–99)
Total Protein: 8.9 g/dL — ABNORMAL HIGH (ref 6.0–8.3)

## 2012-01-29 LAB — CBC
HCT: 41.4 % (ref 36.0–46.0)
Hemoglobin: 14 g/dL (ref 12.0–15.0)
MCH: 28.6 pg (ref 26.0–34.0)
MCHC: 33.8 g/dL (ref 30.0–36.0)
MCV: 84.5 fL (ref 78.0–100.0)
Platelets: 281 10*3/uL (ref 150–400)
RBC: 4.9 MIL/uL (ref 3.87–5.11)
RDW: 13.1 % (ref 11.5–15.5)
WBC: 9.8 10*3/uL (ref 4.0–10.5)

## 2012-01-29 LAB — URINE MICROSCOPIC-ADD ON

## 2012-01-29 LAB — DIFFERENTIAL
Basophils Absolute: 0 K/uL (ref 0.0–0.1)
Basophils Relative: 0 % (ref 0–1)
Eosinophils Absolute: 0 10*3/uL (ref 0.0–0.7)
Eosinophils Relative: 0 % (ref 0–5)
Lymphocytes Relative: 11 % — ABNORMAL LOW (ref 12–46)
Lymphs Abs: 1.1 10*3/uL (ref 0.7–4.0)
Monocytes Absolute: 0.2 10*3/uL (ref 0.1–1.0)
Monocytes Relative: 2 % — ABNORMAL LOW (ref 3–12)
Neutro Abs: 8.5 K/uL — ABNORMAL HIGH (ref 1.7–7.7)
Neutrophils Relative %: 87 % — ABNORMAL HIGH (ref 43–77)

## 2012-01-29 LAB — LIPASE, BLOOD: Lipase: 21 U/L (ref 11–59)

## 2012-01-29 LAB — PREGNANCY, URINE: Preg Test, Ur: NEGATIVE

## 2012-01-29 MED ORDER — ONDANSETRON HCL 4 MG/2ML IJ SOLN
4.0000 mg | Freq: Once | INTRAMUSCULAR | Status: AC
  Administered 2012-01-29: 4 mg via INTRAVENOUS

## 2012-01-29 MED ORDER — IOHEXOL 300 MG/ML  SOLN
40.0000 mL | Freq: Once | INTRAMUSCULAR | Status: AC | PRN
  Administered 2012-01-29: 40 mL via ORAL

## 2012-01-29 MED ORDER — ONDANSETRON HCL 4 MG/2ML IJ SOLN
4.0000 mg | Freq: Once | INTRAMUSCULAR | Status: AC
  Administered 2012-01-29: 4 mg via INTRAVENOUS
  Filled 2012-01-29: qty 2

## 2012-01-29 MED ORDER — ONDANSETRON HCL 4 MG/2ML IJ SOLN: 4.0000 mg | Freq: Three times a day (TID) | INTRAMUSCULAR | Status: DC | PRN

## 2012-01-29 MED ORDER — PANTOPRAZOLE SODIUM 40 MG IV SOLR
40.0000 mg | Freq: Once | INTRAVENOUS | Status: AC
  Administered 2012-01-29: 40 mg via INTRAVENOUS
  Filled 2012-01-29: qty 40

## 2012-01-29 MED ORDER — LIDOCAINE VISCOUS 2 % MT SOLN
20.0000 mL | Freq: Once | OROMUCOSAL | Status: AC
  Administered 2012-01-29: 20 mL via OROMUCOSAL
  Filled 2012-01-29: qty 15

## 2012-01-29 MED ORDER — DIPHENHYDRAMINE HCL 50 MG/ML IJ SOLN
12.5000 mg | Freq: Four times a day (QID) | INTRAMUSCULAR | Status: DC | PRN
  Administered 2012-01-31: 12.5 mg via INTRAVENOUS
  Filled 2012-01-29: qty 1

## 2012-01-29 MED ORDER — NITROFURANTOIN MACROCRYSTAL 100 MG PO CAPS
100.0000 mg | ORAL_CAPSULE | Freq: Once | ORAL | Status: AC
  Administered 2012-01-29: 100 mg via ORAL
  Filled 2012-01-29: qty 1

## 2012-01-29 MED ORDER — SODIUM CHLORIDE 0.9 % IV BOLUS (SEPSIS)
500.0000 mL | Freq: Once | INTRAVENOUS | Status: AC
  Administered 2012-01-29: 1000 mL via INTRAVENOUS

## 2012-01-29 MED ORDER — ENOXAPARIN SODIUM 40 MG/0.4ML ~~LOC~~ SOLN
40.0000 mg | SUBCUTANEOUS | Status: DC
  Administered 2012-01-30 – 2012-01-31 (×2): 40 mg via SUBCUTANEOUS
  Filled 2012-01-29 (×3): qty 0.4

## 2012-01-29 MED ORDER — DIPHENHYDRAMINE HCL 12.5 MG/5ML PO ELIX: 12.5000 mg | ORAL_SOLUTION | Freq: Four times a day (QID) | ORAL | Status: DC | PRN

## 2012-01-29 MED ORDER — ONDANSETRON HCL 4 MG/2ML IJ SOLN
INTRAMUSCULAR | Status: AC
  Filled 2012-01-29: qty 2

## 2012-01-29 MED ORDER — ACETAMINOPHEN 650 MG RE SUPP: 650.0000 mg | Freq: Four times a day (QID) | RECTAL | Status: DC | PRN

## 2012-01-29 MED ORDER — ACETAMINOPHEN 325 MG PO TABS: 650.0000 mg | ORAL_TABLET | Freq: Four times a day (QID) | ORAL | Status: DC | PRN

## 2012-01-29 MED ORDER — IOHEXOL 300 MG/ML  SOLN
100.0000 mL | Freq: Once | INTRAMUSCULAR | Status: AC | PRN
  Administered 2012-01-29: 100 mL via INTRAVENOUS

## 2012-01-29 MED ORDER — NITROFURANTOIN MACROCRYSTAL 50 MG PO CAPS: ORAL_CAPSULE | ORAL | Status: DC

## 2012-01-29 MED ORDER — HYDROMORPHONE HCL PF 1 MG/ML IJ SOLN
0.5000 mg | Freq: Once | INTRAMUSCULAR | Status: AC
  Administered 2012-01-29: 18:00:00 via INTRAVENOUS
  Filled 2012-01-29: qty 1

## 2012-01-29 MED ORDER — SODIUM CHLORIDE 0.9 % IJ SOLN
INTRAMUSCULAR | Status: AC
  Filled 2012-01-29: qty 3

## 2012-01-29 MED ORDER — MORPHINE SULFATE 2 MG/ML IJ SOLN
2.0000 mg | Freq: Once | INTRAMUSCULAR | Status: AC
  Administered 2012-01-29: 2 mg via INTRAVENOUS
  Filled 2012-01-29: qty 1

## 2012-01-29 MED ORDER — HYDROMORPHONE HCL PF 1 MG/ML IJ SOLN: 0.5000 mg | INTRAMUSCULAR | Status: DC | PRN

## 2012-01-29 MED ORDER — HYDROMORPHONE HCL PF 1 MG/ML IJ SOLN
1.0000 mg | INTRAMUSCULAR | Status: DC | PRN
  Administered 2012-01-29 – 2012-02-01 (×9): 1 mg via INTRAVENOUS
  Filled 2012-01-29 (×9): qty 1

## 2012-01-29 MED ORDER — PROMETHAZINE HCL 25 MG PO TABS: 12.5000 mg | ORAL_TABLET | Freq: Four times a day (QID) | ORAL | Status: DC | PRN

## 2012-01-29 MED ORDER — SODIUM CHLORIDE 0.9 % IV SOLN
Freq: Once | INTRAVENOUS | Status: AC
  Administered 2012-01-29: 18:00:00 via INTRAVENOUS

## 2012-01-29 MED ORDER — HYDROMORPHONE HCL PF 1 MG/ML IJ SOLN
1.0000 mg | Freq: Once | INTRAMUSCULAR | Status: AC
  Administered 2012-01-29: 1 mg via INTRAVENOUS
  Filled 2012-01-29: qty 1

## 2012-01-29 MED ORDER — ONDANSETRON HCL 4 MG/2ML IJ SOLN
4.0000 mg | Freq: Four times a day (QID) | INTRAMUSCULAR | Status: DC | PRN
  Administered 2012-01-29 – 2012-01-30 (×2): 4 mg via INTRAVENOUS
  Filled 2012-01-29 (×2): qty 2

## 2012-01-29 MED ORDER — OXYMETAZOLINE HCL 0.05 % NA SOLN
1.0000 | Freq: Once | NASAL | Status: AC
  Administered 2012-01-29: 1 via NASAL
  Filled 2012-01-29: qty 15

## 2012-01-29 MED ORDER — KCL IN DEXTROSE-NACL 20-5-0.45 MEQ/L-%-% IV SOLN
INTRAVENOUS | Status: DC
  Administered 2012-01-29 – 2012-01-30 (×2): via INTRAVENOUS
  Administered 2012-01-30: 125 mL/h via INTRAVENOUS
  Administered 2012-01-31 – 2012-02-01 (×4): via INTRAVENOUS

## 2012-01-29 NOTE — Discharge Instructions (Signed)
YOUR URINE SHOWS THE BEGINNING OF AN INFECTION. I HAVE STARTED YOU ON ANTIBIOTICS. USE THE NAUSEA MEDICINE AS NEEDED. BLAND DIET FOR THE NEXT 6-8 HOURS THEN INCREASE AS TOLERATED.  USE BRAT FOR THE DIARRHEA.   B.R.A.T. Diet Your doctor has recommended the B.R.A.T. diet for you or your child until the condition improves. This is often used to help control diarrhea and vomiting symptoms. If you or your child can tolerate clear liquids, you may have:  Bananas.   Rice.   Applesauce.   Toast (and other simple starches such as crackers, potatoes, noodles).  Be sure to avoid dairy products, meats, and fatty foods until symptoms are better. Fruit juices such as apple, grape, and prune juice can make diarrhea worse. Avoid these. Continue this diet for 2 days or as instructed by your caregiver. Document Released: 11/14/2005 Document Revised: 11/03/2011 Document Reviewed: 05/03/2007 [facility] Patient Information 2012 [place], [place].B.R.A.T. Diet Your doctor has recommended the B.R.A.T. diet for you or your child until the condition improves. This is often used to help control diarrhea and vomiting symptoms. If you or your child can tolerate clear liquids, you may have:  Bananas.   Rice.   Applesauce.   Toast (and other simple starches such as crackers, potatoes, noodles).  Be sure to avoid dairy products, meats, and fatty foods until symptoms are better. Fruit juices such as apple, grape, and prune juice can make diarrhea worse. Avoid these. Continue this diet for 2 days or as instructed by your caregiver. Document Released: 11/14/2005 Document Revised: 11/03/2011 Document Reviewed: 05/03/2007 [facility] Patient Information 2012 [place], [place].

## 2012-01-29 NOTE — ED Notes (Signed)
Pt states that she was discharged and went and got her prescription filled and took 1 dose. States that she vomited and is not feeling any better. Still c/o abdominal pain.  Alert and oriented x 3. Skin warm and dry. Color pink.

## 2012-01-29 NOTE — ED Notes (Signed)
Pt feeling nauseated, edp will be notified

## 2012-01-29 NOTE — ED Notes (Signed)
Waiting for MD to see patient.

## 2012-01-29 NOTE — ED Provider Notes (Signed)
History   This chart was scribed for [employer]. Colon Branch, MD by Clarita Crane. The patient was seen in room APA10/APA10. Patient's care was started at 0906.    CSN: [id number]  Arrival date & time 01/29/12  0906   First MD Initiated Contact with Patient 01/29/12 [phone]      Chief Complaint  Patient presents with  . Abdominal Pain    (Consider location/radiation/quality/duration/timing/severity/associated sxs/prior treatment) HPI [name] is a 36 y.o. female who presents to the Emergency Department complaining of constant moderate to severe diffuse abdominal pain described as sharp and cramping onset yesterday morning and persistent since with associated nausea and vomiting. Denies diarrhea. Reports last bowel movement was several days ago. Patient states she was evaluated in [facility] yesterday for same complaint and prescribed Bentyl with no relief. Denies having abdominal x-ray/CT performed yesterday with evaluation.  History reviewed. No pertinent past medical history.  Past Surgical History  Procedure Date  . Oophorectomy   . Ovarian cyst surgery     History reviewed. No pertinent family history.  History  Substance Use Topics  . Smoking status: Former Games developer  . Smokeless tobacco: Not on file  . Alcohol Use: No    OB History    Grav Para Term Preterm Abortions TAB SAB Ect Mult Living                  Review of Systems 10 Systems reviewed and are negative for acute change except as noted in the HPI.  Allergies  Review of patient's allergies indicates no known allergies.  Home Medications   Current Outpatient Rx  Name Route Sig Dispense Refill  . DICYCLOMINE HCL 20 MG PO TABS Oral Take 20 mg by mouth every 6 (six) hours as needed. Patient was given this med yesterday by Manuela Neptune, MD      BP 125/79  Pulse 57  Temp(Src) 97.3 F (36.3 C) (Oral)  Resp 17  Ht 5\' 5"  (1.651 m)  Wt 160 lb (72.576 kg)  BMI 26.63 kg/m2  SpO2 100%  LMP  01/08/2012  Physical Exam  Nursing note and vitals reviewed. Constitutional: She is oriented to person, place, and time. She appears well-developed and well-nourished. No distress.  HENT:  Head: Normocephalic and atraumatic.  Eyes: EOM are normal. Pupils are equal, round, and reactive to light.  Neck: Neck supple. No tracheal deviation present.  Cardiovascular: Normal rate and regular rhythm.  Exam reveals no friction rub.   No murmur heard. Pulmonary/Chest: Effort normal. No respiratory distress. She has no wheezes. She has no rales.  Abdominal: Soft. Bowel sounds are normal. She exhibits no distension. There is no tenderness. There is no rebound and no guarding.  Musculoskeletal: Normal range of motion. She exhibits no edema.  Neurological: She is alert and oriented to person, place, and time. No sensory deficit.  Skin: Skin is warm and dry.  Psychiatric: She has a normal mood and affect. Her behavior is normal.    ED Course  Procedures (including critical care time)  DIAGNOSTIC STUDIES: Oxygen Saturation is 100% on room air, normal by my interpretation.    COORDINATION OF CARE: 9:41AM- Patient informed of current plan for treatment and evaluation and agrees with plan at this time.  11:25AM- Patient states she is feeling better at this time following administration of IVFs. Will d/c home. Patient agrees with plan.   Results for orders placed during the hospital encounter of 01/29/12  PREGNANCY, URINE      Component  Value Range   Preg Test, Ur NEGATIVE  NEGATIVE   URINALYSIS, ROUTINE W REFLEX MICROSCOPIC      Component Value Range   Color, Urine YELLOW  YELLOW    APPearance CLOUDY (*) CLEAR    Specific Gravity, Urine 1.020  1.005 - 1.030    pH 6.5  5.0 - 8.0    Glucose, UA NEGATIVE  NEGATIVE (mg/dL)   Hgb urine dipstick SMALL (*) NEGATIVE    Bilirubin Urine SMALL (*) NEGATIVE    Ketones, ur >80 (*) NEGATIVE (mg/dL)   Protein, ur TRACE (*) NEGATIVE (mg/dL)   Urobilinogen,  UA 1.0  0.0 - 1.0 (mg/dL)   Nitrite NEGATIVE  NEGATIVE    Leukocytes, UA SMALL (*) NEGATIVE   URINE MICROSCOPIC-ADD ON      Component Value Range   Squamous Epithelial / LPF FEW (*) RARE    WBC, UA 3-6  <3 (WBC/hpf)   RBC / HPF 11-20  <3 (RBC/hpf)   Bacteria, UA MANY (*) RARE    Urine-Other MUCOUS PRESENT      Dg Abd Acute W/chest  01/29/2012  *RADIOLOGY REPORT*  Clinical Data: Nausea and abdominal pain  ACUTE ABDOMEN SERIES (ABDOMEN 2 VIEW & CHEST 1 VIEW)  Comparison: None  Findings: Heart is normal.  Lungs are clear.  No free air beneath hemidiaphragms.  On the upright exam,  there are gas filled loop of small bowel which mildly dilated and have short air fluid levels.  This is suggestive of ileus or partial obstruction.  There is stool throughout the colon.  Small amount gas in the rectum.  IMPRESSION:  Loops of small bowel in the right lower quadrant with short air fluid levels.  This could represent focal ileus or early obstruction  Original Report Authenticated By: Genevive Bi, M.D.   MDM  Patient with nausea, vomiting and diarrhea x 3 days here with continued abdominal pain with diarrhea. Was seen in the ER and given Bentyl with no relief.  Given IVF, analgesic, antiemetic and PPI. Pain and nausea resolved. Xray shows probable illus. Suspect bentyl has made it worse. In addition her urine indicates she may have a UTI. Will initiate antibiotic therapy. Pt feels improved after observation and/or treatment in ED.Pt stable in ED with no significant deterioration in condition.The patient appears reasonably screened and/or stabilized for discharge and I doubt any other medical condition or other [facility] requiring further screening, evaluation, or treatment in the ED at this time prior to discharge.  I personally performed the services described in this documentation, which was scribed in my presence. The recorded information has been reviewed and considered.  MDM Reviewed: nursing note and  vitals Interpretation: x-ray and labs          [employer]. Colon Branch, MD 01/29/12 1155

## 2012-01-29 NOTE — ED Notes (Signed)
Attempted to call report to Mardella Layman, she will call me back.

## 2012-01-29 NOTE — ED Provider Notes (Addendum)
History     CSN: [id number]  Arrival date & time 01/29/12  1539   First MD Initiated Contact with Patient 01/29/12 1720      Chief Complaint  Patient presents with  . Abdominal Pain  . Emesis    (Consider location/radiation/quality/duration/timing/severity/associated sxs/prior treatment) HPI Comments: Patient reports she woke up with pain in her periumbilical and epigastric region that was very sharp in nature. She reports that she has not had a bowel movement since the day prior to that. She was seen at the local emergency department in [place] prescriptions for the discomfort. No urinalysis apparently or pelvic examination or radiographs were performed at the time. She reports taking the medication without significant improvement. She reports earlier this morning she developed worsening pain which now has extended into the sides of her abdomen but not into her back her pelvis and associated with vomiting. She's not had diarrhea in fact continues to not pass any flatus since 4 days ago. She reports that she's had 2 surgeries one was opened where they had to remove an ovary several years ago at [facility] hospital. This past January she had a followup laparoscopic surgery to remove some cysts off of her remaining ovary. She reports with her hospitalization several years ago, she developed an ileus or bowel obstruction requiring an NG tube at the time. She denies any sick contacts. She denies vaginal bleeding or discharge.  Patient is a 36 y.o. female presenting with abdominal pain and vomiting. The history is provided by the patient and medical records.  Abdominal Pain The primary symptoms of the illness include abdominal pain, nausea and vomiting. The primary symptoms of the illness do not include fever, diarrhea or dysuria.  Symptoms associated with the illness do not include back pain.  Emesis  Associated symptoms include abdominal pain. Pertinent negatives include no diarrhea and no  fever.    History reviewed. No pertinent past medical history.  Past Surgical History  Procedure Date  . Oophorectomy   . Ovarian cyst surgery     History reviewed. No pertinent family history.  History  Substance Use Topics  . Smoking status: Former Games developer  . Smokeless tobacco: Not on file  . Alcohol Use: No    OB History    Grav Para Term Preterm Abortions TAB SAB Ect Mult Living                  Review of Systems  Constitutional: Negative for fever.  Gastrointestinal: Positive for nausea, vomiting and abdominal pain. Negative for diarrhea.  Genitourinary: Negative for dysuria.  Musculoskeletal: Negative for back pain.  All other systems reviewed and are negative.    Allergies  Review of patient's allergies indicates no known allergies.  Home Medications   Current Outpatient Rx  Name Route Sig Dispense Refill  . DICYCLOMINE HCL 20 MG PO TABS Oral Take 20 mg by mouth every 6 (six) hours as needed. Patient was given this med yesterday by Manuela Neptune, MD    . PROMETHAZINE HCL 25 MG PO TABS Oral Take 0.5 tablets (12.5 mg total) by mouth every 6 (six) hours as needed for nausea. 10 tablet 0  . NITROFURANTOIN MACROCRYSTAL 50 MG PO CAPS  Take one twice a day for 5 days 10 capsule 0    BP 111/83  Pulse 62  Temp(Src) 97.9 F (36.6 C) (Oral)  Resp 18  Ht 5\' 5"  (1.651 m)  Wt 160 lb (72.576 kg)  BMI 26.63 kg/m2  SpO2 100%  LMP 01/08/2012  Physical Exam  Nursing note and vitals reviewed. Constitutional: She is oriented to person, place, and time. She appears well-developed and well-nourished.  Non-toxic appearance.  HENT:  Head: Normocephalic.  Eyes: Pupils are equal, round, and reactive to light. No scleral icterus.  Cardiovascular: Normal rate and regular rhythm.   Pulmonary/Chest: Effort normal. No respiratory distress. She has no wheezes.  Abdominal: She exhibits distension. She exhibits no mass. There is tenderness. There is no rebound.    Musculoskeletal: She exhibits no edema.  Neurological: She is alert and oriented to person, place, and time.  Skin: Skin is warm and dry. She is not diaphoretic.  Psychiatric: She has a normal mood and affect.    ED Course  Procedures (including critical care time)  Labs Reviewed  DIFFERENTIAL - Abnormal; Notable for the following:    Neutrophils Relative 87 (*)    Neutro Abs 8.5 (*)    Lymphocytes Relative 11 (*)    Monocytes Relative 2 (*)    All other components within normal limits  COMPREHENSIVE METABOLIC PANEL - Abnormal; Notable for the following:    Total Protein 8.9 (*)    GFR calc non Af Amer 86 (*)    All other components within normal limits  CBC  LIPASE, BLOOD   Ct Abdomen Pelvis W Contrast  01/29/2012  *RADIOLOGY REPORT*  Clinical Data: Abdominal pain with nausea and vomiting.  History of unilateral oophorectomy for ovarian cyst.  CT ABDOMEN AND PELVIS WITH CONTRAST  Technique:  Multidetector CT imaging of the abdomen and pelvis was performed following the standard protocol during bolus administration of intravenous contrast.  Contrast: OMNIPAQUE IOHEXOL 300 MG/ML IJ SOLN  Comparison: Acute abdominal series same date. Report from CT 09/26/2001 at [facility] - unavailable for comparison.  Findings: The lung bases are clear.  There is no pleural effusion. The liver, gallbladder, biliary system and pancreas appear normal. The spleen, adrenal glands and kidneys appear normal.  The stomach is mildly distended with contrast.  The proximal small bowel is normal in caliber.  There are multiple dilated loops of mid small bowel in the lower abdomen and pelvis.  The terminal ileum is decompressed.  Transition point is not well visualized, but appears to be in the right pelvis. There is no evidence of hernia.  No colonic abnormalities are identified.  The appendix appears normal. There is no bowel wall thickening or pneumatosis.  There is a probable collapsing right ovarian  follicle measuring 2.2 cm on image 61.  No left adnexal mass is seen status post presumed left oophorectomy. No uterine abnormality is apparent.  There is a moderate amount of fluid in the cul-de-sac.  There is a left perivaginal cyst on image 80, likely a Bartholin cyst.  IMPRESSION:  1.  High-grade distal small bowel obstruction, likely from pelvic adhesions. 2.  Moderate amount of free pelvic fluid. 3.  The solid visceral organs appear unremarkable. 4.  Left perivaginal cyst, likely a Bartholin cyst.  Original Report Authenticated By: Gerrianne Scale, M.D.   Dg Abd Acute W/chest  01/29/2012  *RADIOLOGY REPORT*  Clinical Data: Nausea and abdominal pain  ACUTE ABDOMEN SERIES (ABDOMEN 2 VIEW & CHEST 1 VIEW)  Comparison: None  Findings: Heart is normal.  Lungs are clear.  No free air beneath hemidiaphragms.  On the upright exam,  there are gas filled loop of small bowel which mildly dilated and have short air fluid levels.  This is suggestive of ileus or partial  obstruction.  There is stool throughout the colon.  Small amount gas in the rectum.  IMPRESSION:  Loops of small bowel in the right lower quadrant with short air fluid levels.  This could represent focal ileus or early obstruction  Original Report Authenticated By: Genevive Bi, M.D.     1. Small bowel obstruction due to adhesions     RA sat of 100% is normal  MDM  I reviewed pt's prior plain films from today.  Will get CT to r/o obstruction.  Pt feels improved after IV meds.     CT scan shows high grade obstruction, I spoke to general surgeon Dr. Lovell Sheehan who will see in hospital later tonight or early AM.  Holding orders written.  Will keep NPO, IVF hydration, prn meds for nausea and pain.       Gavin Pound. Oletta Lamas, MD 01/29/12 2125  Gavin Pound. Jozie Wulf, MD 03/16/12 1154

## 2012-01-29 NOTE — ED Notes (Addendum)
Pt presents abdominal pain to bilateral upper quadrants and vomiting since Friday. Pt last BM was Thursday. Pt was discharged from this ED at 1300.

## 2012-01-29 NOTE — ED Notes (Signed)
Pt c/o nausea, vomiting x 3 and abdominal pain since 3 am on Saturday. Pt seen at [facility] yesterday am and told that she had a virus. Pt states that she does not feel any better this am.

## 2012-01-29 NOTE — ED Notes (Addendum)
16 french ng tube placed in left nare. 225 ml of yellow fluid came out. Pt tolerated well.

## 2012-01-30 LAB — BASIC METABOLIC PANEL
BUN: 13 mg/dL (ref 6–23)
Calcium: 8.9 mg/dL (ref 8.4–10.5)
Creatinine, Ser: 0.77 mg/dL (ref 0.50–1.10)
GFR calc Af Amer: >90 mL/min (ref >90)
GFR calc non Af Amer: >90 mL/min (ref >90)
Glucose, Bld: 139 mg/dL — ABNORMAL HIGH (ref 70–99)
Potassium: 3.9 mEq/L (ref 3.5–5.1)

## 2012-01-30 LAB — CBC
HCT: 33.3 % — ABNORMAL LOW (ref 36.0–46.0)
MCH: 28.4 pg (ref 26.0–34.0)
MCHC: 33.3 g/dL (ref 30.0–36.0)
MCV: 85.2 fL (ref 78.0–100.0)
Platelets: 248 10*3/uL (ref 150–400)
RDW: 13.4 % (ref 11.5–15.5)

## 2012-01-30 MED ORDER — MENTHOL 3 MG MT LOZG
1.0000 | LOZENGE | OROMUCOSAL | Status: DC | PRN
  Filled 2012-01-30: qty 9

## 2012-01-30 NOTE — H&P (Signed)
[name] is an 36 y.o. female.   Chief Complaint: Nausea, abdominal pain, vomiting HPI: Patient is a 37 year old black female presents with a five-day history of worsening nausea, vomiting, abdominal pain. She has been seen in several emergency room was. She subsequently underwent CT scan the abdomen and pelvis which reveals a possible small bowel obstruction. She is status post a left oophorectomy at [facility] in the past. She states that she does feel better today with a nasogastric tube. She denies a bowel movement or flatus. She denies any fever or chills.  History reviewed. No pertinent past medical history.  Past Surgical History  Procedure Date  . Oophorectomy   . Ovarian cyst surgery     History reviewed. No pertinent family history. Social History:  reports that she has quit smoking. She does not have any smokeless tobacco history on file. She reports that she does not drink alcohol or use illicit drugs.  Allergies: No Known Allergies  Medications Prior to Admission  Medication Dose Route Frequency Provider Last Rate Last Dose  . 0.9 %  sodium chloride infusion   Intravenous Once Gavin Pound. Ghim, MD 125 mL/hr at 01/29/12 1804    . acetaminophen (TYLENOL) tablet 650 mg  650 mg Oral Q6H PRN Dalia Heading, MD       Or  . acetaminophen (TYLENOL) suppository 650 mg  650 mg Rectal Q6H PRN Dalia Heading, MD      . dextrose 5 % and 0.45 % NaCl with KCl 20 mEq/L infusion   Intravenous Continuous Dalia Heading, MD 125 mL/hr at 01/29/12 2312    . diphenhydrAMINE (BENADRYL) injection 12.5-25 mg  12.5-25 mg Intravenous Q6H PRN Dalia Heading, MD       Or  . diphenhydrAMINE (BENADRYL) 12.5 MG/5ML elixir 12.5-25 mg  12.5-25 mg Oral Q6H PRN Dalia Heading, MD      . enoxaparin (LOVENOX) injection 40 mg  40 mg Subcutaneous Q24H Dalia Heading, MD   40 mg at 01/30/12 7829  . HYDROmorphone (DILAUDID) injection 0.5 mg  0.5 mg Intravenous Once Gavin Pound. Ghim, MD      . HYDROmorphone  (DILAUDID) injection 1 mg  1 mg Intravenous Once Gavin Pound. Ghim, MD   1 mg at 01/29/12 2056  . HYDROmorphone (DILAUDID) injection 1 mg  1 mg Intravenous Q3H PRN Dalia Heading, MD   1 mg at 01/30/12 1009  . iohexol (OMNIPAQUE) 300 MG/ML solution 100 mL  100 mL Intravenous Once PRN Medication Radiologist, MD   100 mL at 01/29/12 1915  . iohexol (OMNIPAQUE) 300 MG/ML solution 40 mL  40 mL Oral Once PRN Medication Radiologist, MD   40 mL at 01/29/12 1800  . lidocaine (XYLOCAINE) 2 % viscous mouth solution 20 mL  20 mL Mouth/Throat Once Gavin Pound. Ghim, MD   20 mL at 01/29/12 2111  . menthol-cetylpyridinium (CEPACOL) lozenge 3 mg  1 lozenge Oral PRN Dalia Heading, MD      . morphine 2 MG/ML injection 2 mg  2 mg Intravenous Once Nicoletta Dress. Colon Branch, MD   2 mg at 01/29/12 1048  . nitrofurantoin (MACRODANTIN) capsule 100 mg  100 mg Oral Once [employer]. Colon Branch, MD   100 mg at 01/29/12 1207  . ondansetron (ZOFRAN) 4 MG/2ML injection           . ondansetron (ZOFRAN) injection 4 mg  4 mg Intravenous Once [employer]. Colon Branch, MD   4 mg at 01/29/12 1047  .  ondansetron (ZOFRAN) injection 4 mg  4 mg Intravenous Once Gavin Pound. Ghim, MD   4 mg at 01/29/12 1805  . ondansetron (ZOFRAN) injection 4 mg  4 mg Intravenous Once Gavin Pound. Ghim, MD   4 mg at 01/29/12 1954  . ondansetron (ZOFRAN) injection 4 mg  4 mg Intravenous Q6H PRN Dalia Heading, MD   4 mg at 01/29/12 2319  . oxymetazoline (AFRIN) 0.05 % nasal spray 1 spray  1 spray Each Nare Once Gavin Pound. Ghim, MD   1 spray at 01/29/12 2111  . pantoprazole (PROTONIX) injection 40 mg  40 mg Intravenous Once [employer]. Colon Branch, MD   40 mg at 01/29/12 1050  . sodium chloride 0.9 % bolus 500 mL  500 mL Intravenous Once [employer]. Colon Branch, MD   1,000 mL at 01/29/12 1046  . sodium chloride 0.9 % injection           . DISCONTD: HYDROmorphone (DILAUDID) injection 0.5 mg  0.5 mg Intravenous Q3H PRN Gavin Pound. Ghim, MD      . DISCONTD: ondansetron (ZOFRAN) injection 4 mg  4 mg  Intravenous Q8H PRN Gavin Pound. Ghim, MD       No current outpatient prescriptions on file as of 01/30/2012.    Results for orders placed during the hospital encounter of 01/29/12 (from the past 48 hour(s))  CBC     Status: Normal   Collection Time   01/29/12  5:55 PM      Component Value Range Comment   WBC 9.8  4.0 - 10.5 (K/uL)    RBC 4.90  3.87 - 5.11 (MIL/uL)    Hemoglobin 14.0  12.0 - 15.0 (g/dL)    HCT 16.1  09.6 - 04.5 (%)    MCV 84.5  78.0 - 100.0 (fL)    MCH 28.6  26.0 - 34.0 (pg)    MCHC 33.8  30.0 - 36.0 (g/dL)    RDW 40.9  81.1 - 91.4 (%)    Platelets 281  150 - 400 (K/uL)   DIFFERENTIAL     Status: Abnormal   Collection Time   01/29/12  5:55 PM      Component Value Range Comment   Neutrophils Relative 87 (*) 43 - 77 (%)    Neutro Abs 8.5 (*) 1.7 - 7.7 (K/uL)    Lymphocytes Relative 11 (*) 12 - 46 (%)    Lymphs Abs 1.1  0.7 - 4.0 (K/uL)    Monocytes Relative 2 (*) 3 - 12 (%)    Monocytes Absolute 0.2  0.1 - 1.0 (K/uL)    Eosinophils Relative 0  0 - 5 (%)    Eosinophils Absolute 0.0  0.0 - 0.7 (K/uL)    Basophils Relative 0  0 - 1 (%)    Basophils Absolute 0.0  0.0 - 0.1 (K/uL)   COMPREHENSIVE METABOLIC PANEL     Status: Abnormal   Collection Time   01/29/12  5:55 PM      Component Value Range Comment   Sodium 137  135 - 145 (mEq/L)    Potassium 3.8  3.5 - 5.1 (mEq/L)    Chloride 101  96 - 112 (mEq/L)    CO2 24  19 - 32 (mEq/L)    Glucose, Bld 80  70 - 99 (mg/dL)    BUN 14  6 - 23 (mg/dL)    Creatinine, Ser 7.82  0.50 - 1.10 (mg/dL)    Calcium 95.6  8.4 - 10.5 (mg/dL)  Total Protein 8.9 (*) 6.0 - 8.3 (g/dL)    Albumin 4.4  3.5 - 5.2 (g/dL)    AST 16  0 - 37 (U/L)    ALT 13  0 - 35 (U/L)    Alkaline Phosphatase 66  39 - 117 (U/L)    Total Bilirubin 0.7  0.3 - 1.2 (mg/dL)    GFR calc non Af Amer 86 (*) >90 (mL/min)    GFR calc Af Amer >90  >90 (mL/min)   LIPASE, BLOOD     Status: Normal   Collection Time   01/29/12  5:55 PM      Component Value Range Comment     Lipase 21  11 - 59 (U/L)   BASIC METABOLIC PANEL     Status: Abnormal   Collection Time   01/30/12  5:19 AM      Component Value Range Comment   Sodium 136  135 - 145 (mEq/L)    Potassium 3.9  3.5 - 5.1 (mEq/L)    Chloride 105  96 - 112 (mEq/L)    CO2 24  19 - 32 (mEq/L)    Glucose, Bld 139 (*) 70 - 99 (mg/dL)    BUN 13  6 - 23 (mg/dL)    Creatinine, Ser 1.61  0.50 - 1.10 (mg/dL)    Calcium 8.9  8.4 - 10.5 (mg/dL)    GFR calc non Af Amer >90  >90 (mL/min)    GFR calc Af Amer >90  >90 (mL/min)   MAGNESIUM     Status: Normal   Collection Time   01/30/12  5:19 AM      Component Value Range Comment   Magnesium 2.1  1.5 - 2.5 (mg/dL)   PHOSPHORUS     Status: Normal   Collection Time   01/30/12  5:19 AM      Component Value Range Comment   Phosphorus 3.6  2.3 - 4.6 (mg/dL)   CBC     Status: Abnormal   Collection Time   01/30/12  5:19 AM      Component Value Range Comment   WBC 9.9  4.0 - 10.5 (K/uL)    RBC 3.91  3.87 - 5.11 (MIL/uL)    Hemoglobin 11.1 (*) 12.0 - 15.0 (g/dL)    HCT 09.6 (*) 04.5 - 46.0 (%)    MCV 85.2  78.0 - 100.0 (fL)    MCH 28.4  26.0 - 34.0 (pg)    MCHC 33.3  30.0 - 36.0 (g/dL)    RDW 40.9  81.1 - 91.4 (%)    Platelets 248  150 - 400 (K/uL)    Ct Abdomen Pelvis W Contrast  01/29/2012  *RADIOLOGY REPORT*  Clinical Data: Abdominal pain with nausea and vomiting.  History of unilateral oophorectomy for ovarian cyst.  CT ABDOMEN AND PELVIS WITH CONTRAST  Technique:  Multidetector CT imaging of the abdomen and pelvis was performed following the standard protocol during bolus administration of intravenous contrast.  Contrast: OMNIPAQUE IOHEXOL 300 MG/ML IJ SOLN  Comparison: Acute abdominal series same date. Report from CT 09/26/2001 at [facility] - unavailable for comparison.  Findings: The lung bases are clear.  There is no pleural effusion. The liver, gallbladder, biliary system and pancreas appear normal. The spleen, adrenal glands and kidneys appear normal.   The stomach is mildly distended with contrast.  The proximal small bowel is normal in caliber.  There are multiple dilated loops of mid small bowel in the lower abdomen and pelvis.  The terminal ileum is decompressed.  Transition point is not well visualized, but appears to be in the right pelvis. There is no evidence of hernia.  No colonic abnormalities are identified.  The appendix appears normal. There is no bowel wall thickening or pneumatosis.  There is a probable collapsing right ovarian follicle measuring 2.2 cm on image 61.  No left adnexal mass is seen status post presumed left oophorectomy. No uterine abnormality is apparent.  There is a moderate amount of fluid in the cul-de-sac.  There is a left perivaginal cyst on image 80, likely a Bartholin cyst.  IMPRESSION:  1.  High-grade distal small bowel obstruction, likely from pelvic adhesions. 2.  Moderate amount of free pelvic fluid. 3.  The solid visceral organs appear unremarkable. 4.  Left perivaginal cyst, likely a Bartholin cyst.  Original Report Authenticated By: Gerrianne Scale, M.D.   Dg Abd Acute W/chest  01/29/2012  *RADIOLOGY REPORT*  Clinical Data: Nausea and abdominal pain  ACUTE ABDOMEN SERIES (ABDOMEN 2 VIEW & CHEST 1 VIEW)  Comparison: None  Findings: Heart is normal.  Lungs are clear.  No free air beneath hemidiaphragms.  On the upright exam,  there are gas filled loop of small bowel which mildly dilated and have short air fluid levels.  This is suggestive of ileus or partial obstruction.  There is stool throughout the colon.  Small amount gas in the rectum.  IMPRESSION:  Loops of small bowel in the right lower quadrant with short air fluid levels.  This could represent focal ileus or early obstruction  Original Report Authenticated By: Genevive Bi, M.D.    Review of Systems  Constitutional: Positive for malaise/fatigue.  HENT: Negative.   Eyes: Negative.   Respiratory: Negative.   Cardiovascular: Negative.     Gastrointestinal: Positive for nausea, vomiting and abdominal pain.  Genitourinary: Negative.   Musculoskeletal: Negative.   Skin: Negative.   Neurological: Negative.   Endo/Heme/Allergies: Negative.   Psychiatric/Behavioral: Negative.     Blood pressure 124/79, pulse 64, temperature 98.3 F (36.8 C), temperature source Oral, resp. rate 20, height 5\' 5"  (1.651 m), weight 72.5 kg (159 lb 13.3 oz), last menstrual period 01/08/2012, SpO2 100.00%. Physical Exam  Constitutional: She is oriented to person, place, and time. She appears well-developed and well-nourished.  HENT:  Head: Normocephalic and atraumatic.  Neck: Normal range of motion. Neck supple.  Cardiovascular: Normal rate, regular rhythm and normal heart sounds.   Respiratory: Effort normal.  GI: Soft. She exhibits no distension. There is no tenderness.       Occassional bowel sounds.  Neurological: She is alert and oriented to person, place, and time.  Skin: Skin is warm and dry.     Assessment/Plan Impression: Abdominal pain, question small bowel obstruction versus ileus from ruptured right ovarian cyst Plan: No need for acute surgical intervention at this time. We'll continue nasogastric tube decompression. I told patient and family that she may require surgery should this not resolve on its own. All questions were answered.  Norvil Martensen A 01/30/2012, 10:27 AM

## 2012-01-30 NOTE — Plan of Care (Signed)
Problem: Phase I Progression Outcomes Goal: OOB as tolerated unless otherwise ordered Outcome: Completed/Met Date Met:  01/30/12 Pt ambulates to the bathroom.

## 2012-01-31 LAB — CBC
HCT: 31.1 % — ABNORMAL LOW (ref 36.0–46.0)
MCHC: 33.1 g/dL (ref 30.0–36.0)
Platelets: 224 10*3/uL (ref 150–400)
RDW: 13.3 % (ref 11.5–15.5)
WBC: 7.5 10*3/uL (ref 4.0–10.5)

## 2012-01-31 LAB — BASIC METABOLIC PANEL
BUN: 9 mg/dL (ref 6–23)
Calcium: 9 mg/dL (ref 8.4–10.5)
Creatinine, Ser: 0.76 mg/dL (ref 0.50–1.10)
GFR calc Af Amer: >90 mL/min (ref >90)

## 2012-01-31 MED ORDER — ENOXAPARIN SODIUM 40 MG/0.4ML ~~LOC~~ SOLN
40.0000 mg | SUBCUTANEOUS | Status: DC
  Administered 2012-01-31 – 2012-02-02 (×2): 40 mg via SUBCUTANEOUS
  Filled 2012-01-31: qty 0.4

## 2012-01-31 MED ORDER — BISACODYL 10 MG RE SUPP
10.0000 mg | Freq: Once | RECTAL | Status: AC
  Administered 2012-01-31: 10 mg via RECTAL
  Filled 2012-01-31: qty 1

## 2012-01-31 NOTE — Progress Notes (Signed)
Subjective: No significant complaints. Has not had Ali bowel movement or flatus yet.  Objective: Vital signs in last 24 hours: Temp:  [97.8 F (36.6 C)-99.4 F (37.4 C)] 99.4 F (37.4 C) (03/05 0547) Pulse Rate:  [26-77] 74  (03/05 0547) Resp:  [20] 20  (03/05 0547) BP: (116-142)/(72-89) 116/72 mmHg (03/05 0547) SpO2:  [99 %-100 %] 99 % (03/05 0547) Last BM Date: 01/26/12  Intake/Output from previous day: 03/04 0701 - 03/05 0700 In: 3125 [I.V.:3125] Out: 651 [Urine:1; Emesis/NG output:650] Intake/Output this shift:    General appearance: alert and cooperative Resp: clear to auscultation bilaterally Cardio: regular rate and rhythm, S1, S2 normal, no murmur, click, rub or gallop GI: Soft, not particularly distended. No rigidity noted. Minimal bowel sounds heard.  Lab Results:   Basename 01/31/12 0528 01/30/12 0519  WBC 7.5 9.9  HGB 10.3* 11.1*  HCT 31.1* 33.3*  PLT 224 248   BMET  Basename 01/31/12 0528 01/30/12 0519  NA 135 136  K 3.8 3.9  CL 103 105  CO2 29 24  GLUCOSE 119* 139*  BUN 9 13  CREATININE 0.76 0.77  CALCIUM 9.0 8.9   PT/INR No results found for this basename: LABPROT:2,INR:2 in the last 72 hours  Studies/Results: Ct Abdomen Pelvis W Contrast  01/29/2012  *RADIOLOGY REPORT*  Clinical Data: Abdominal pain with nausea and vomiting.  History of unilateral oophorectomy for ovarian cyst.  CT ABDOMEN AND PELVIS WITH CONTRAST  Technique:  Multidetector CT imaging of the abdomen and pelvis was performed following the standard protocol during bolus administration of intravenous contrast.  Contrast: OMNIPAQUE IOHEXOL 300 MG/ML IJ SOLN  Comparison: Acute abdominal series same date. Report from CT 09/26/2001 at [facility] - unavailable for comparison.  Findings: The lung bases are clear.  There is no pleural effusion. The liver, gallbladder, biliary system and pancreas appear normal. The spleen, adrenal glands and kidneys appear normal.  The stomach  is mildly distended with contrast.  The proximal small bowel is normal in caliber.  There are multiple dilated loops of mid small bowel in the lower abdomen and pelvis.  The terminal ileum is decompressed.  Transition point is not well visualized, but appears to be in the right pelvis. There is no evidence of hernia.  No colonic abnormalities are identified.  The appendix appears normal. There is no bowel wall thickening or pneumatosis.  There is Ali probable collapsing right ovarian follicle measuring 2.2 cm on image 61.  No left adnexal mass is seen status post presumed left oophorectomy. No uterine abnormality is apparent.  There is Ali moderate amount of fluid in the cul-de-sac.  There is Ali left perivaginal cyst on image 80, likely Ali Bartholin cyst.  IMPRESSION:  1.  High-grade distal small bowel obstruction, likely from pelvic adhesions. 2.  Moderate amount of free pelvic fluid. 3.  The solid visceral organs appear unremarkable. 4.  Left perivaginal cyst, likely Ali Bartholin cyst.  Original Report Authenticated By: Gerrianne Scale, M.D.   Dg Abd Acute W/chest  01/29/2012  *RADIOLOGY REPORT*  Clinical Data: Nausea and abdominal pain  ACUTE ABDOMEN SERIES (ABDOMEN 2 VIEW & CHEST 1 VIEW)  Comparison: None  Findings: Heart is normal.  Lungs are clear.  No free air beneath hemidiaphragms.  On the upright exam,  there are gas filled loop of small bowel which mildly dilated and have short air fluid levels.  This is suggestive of ileus or partial obstruction.  There is stool throughout the colon.  Small amount gas in the rectum.  IMPRESSION:  Loops of small bowel in the right lower quadrant with short air fluid levels.  This could represent focal ileus or early obstruction  Original Report Authenticated By: Genevive Bi, M.D.    Anti-infectives: Anti-infectives    None      Assessment/Plan: Impression: Small bowel obstruction versus ileus. We'll continue nasogastric tube decompression for now. Will reassess  again in the next 24 hours. Should she not progress, she will require exploratory laparotomy. This has been explained to the patient, agrees to the treatment plan.  LOS: 2 days    [name] 01/31/2012

## 2012-01-31 NOTE — Progress Notes (Signed)
NG tube drainage now a light yellow color, which has cleared up since this morning.

## 2012-02-01 LAB — BASIC METABOLIC PANEL
BUN: 5 mg/dL — ABNORMAL LOW (ref 6–23)
Chloride: 101 mEq/L (ref 96–112)
Creatinine, Ser: 0.78 mg/dL (ref 0.50–1.10)
GFR calc Af Amer: >90 mL/min (ref >90)
GFR calc non Af Amer: >90 mL/min (ref >90)
Potassium: 3.8 mEq/L (ref 3.5–5.1)

## 2012-02-01 MED ORDER — BISACODYL 10 MG RE SUPP
10.0000 mg | Freq: Every morning | RECTAL | Status: DC
  Administered 2012-02-01: 10 mg via RECTAL
  Filled 2012-02-01: qty 1

## 2012-02-01 MED ORDER — MAGNESIUM HYDROXIDE 400 MG/5ML PO SUSP
30.0000 mL | Freq: Two times a day (BID) | ORAL | Status: DC
  Administered 2012-02-01: 30 mL via ORAL
  Filled 2012-02-01: qty 30

## 2012-02-01 NOTE — Progress Notes (Signed)
  Subjective: Did have a bowel movement yesterday. Abdominal pain has resolved. She does not feel bloated.  Objective: Vital signs in last 24 hours: Temp:  [98.7 F (37.1 C)-99.2 F (37.3 C)] 98.7 F (37.1 C) (03/06 0558) Pulse Rate:  [72-78] 73  (03/06 0558) Resp:  [18] 18  (03/06 0558) BP: (125-133)/(83-89) 125/83 mmHg (03/06 0558) SpO2:  [97 %-100 %] 99 % (03/06 0558) Last BM Date: 01/31/12  Intake/Output from previous day: 03/05 0701 - 03/06 0700 In: 2644.2 [I.V.:2644.2] Out: 2225 [Urine:775; Emesis/NG output:850] Intake/Output this shift:    General appearance: alert, cooperative and no distress Resp: clear to auscultation bilaterally Cardio: regular rate and rhythm, S1, S2 normal, no murmur, click, rub or gallop GI: soft, non-tender; bowel sounds normal; no masses,  no organomegaly  Lab Results:   [facility] 01/31/12 0528 01/30/12 0519  WBC 7.5 9.9  HGB 10.3* 11.1*  HCT 31.1* 33.3*  PLT 224 248   BMET  Basename 02/01/12 0539 01/31/12 0528  NA 136 135  K 3.8 3.8  CL 101 103  CO2 28 29  GLUCOSE 98 119*  BUN 5* 9  CREATININE 0.78 0.76  CALCIUM 9.4 9.0   PT/INR No results found for this basename: LABPROT:2,INR:2 in the last 72 hours  Studies/Results: No results found.  Anti-infectives: Anti-infectives    None      Assessment/Plan: Impression: Small bowel obstruction, hopefully resolving. We'll remove NG tube and advanced diet to a clear liquid diet. Patient has been encouraged to ambulate. Will continue to monitor expectantly.  LOS: 3 days    Tawonna Esquer A 02/01/2012

## 2012-02-01 NOTE — Progress Notes (Signed)
Utilization review completed.  

## 2012-02-02 NOTE — Discharge Instructions (Signed)
Intestinal Obstruction An intestinal obstruction comes from a physical blockage in the bowel. Different problems in the bowel may cause these blockages.  CAUSES   Adhesions from previous surgeries   Cancer or tumor   A hernia, which is a condition in which a portion of the bowel bulges out through an opening or weakness in the abdomen (belly). This sometimes squeezes the bowel.   A swallowed object.   Blockage (impaction) with worms is common in third world countries.   A twisting of the bowel or telescoping of a portion of the bowel into another portion (intussusceptions)   Anything that stops food from going through from the stomach to the anus.  SYMPTOMS  Symptoms of bowel obstruction may result in your belly getting bigger (bloating), nausea, vomiting, explosive diarrhea, explosive stool. You may not be able to hear your normal bowel sounds (such as "growling in your stomach"). You may also stop having bowel movements or passing gas. DIAGNOSIS  Usually this condition is diagnosed with a history and an examination. Often, lab studies (blood work) and x-rays may be used to find the cause. TREATMENT  The main treatment of this condition is to rest the intestine (gut). Often times the obstruction may relieve itself and allow the gut to start working again. Think of the gut like a balloon that is blown up (filled with trapped food and water) which has squeezed into a hole or area which it cannot get through.   If the obstruction is complete, an NG tube (nasogastric) is passed through the nose and into the stomach. It is then connected to suction to keep the stomach emptied out. This also helps treat the nausea and vomiting.   If there is an imbalance in the electrolytes, they are corrected with intravenous fluids. These have the proper chemicals in them to correct the problem.   If the reason for the obstruction (blockage) does not get better with conservative (non-surgical) treatment,  surgery may be necessary. Sometimes, surgery is done immediately if your surgeon knows that the problem is not going to get better with conservative treatment.  PROGNOSIS  Depending on what the problem is, most of these problems can be treated by your caregivers with good results. Your surgeon will discuss the best course of action to take, with you or your loved ones. FOLLOWING SURGERY Seek immediate medical attention if you have:  Increasing abdominal pain, repeated vomiting, dehydration or fainting.   Severe weakness, chest pain or back pain.   Blood in your vomit, stool or you have tarry stool.  Document Released: 02/04/2004 Document Revised: 11/03/2011 Document Reviewed: 07/04/2008 [facility] Patient Information 2012 [place], [place].Intestinal Obstruction Care After Please read the instructions outlined below. Refer to these instructions for the next few weeks. These discharge instructions provide you with general information on caring for yourself after surgery. Your caregiver may also give you specific instructions. While your treatment has been planned according to the most current medical practices available, unavoidable complications occasionally occur. If you have any problems or questions after discharge, please call your caregiver. HOME CARE INSTRUCTIONS   It is normal to be sore for a couple weeks following surgery. See your caregiver if this seems to be getting worse rather than better.   Take prescribed medicine as directed. Only take over-the-counter or prescription medicines for pain, discomfort, and or fever, as directed by your caregiver.   Use showers for bathing, until seen or as instructed.   Change dressings if necessary or as directed.  You may resume normal diet and activities as directed or allowed.   Liquids and soft foods may be easier to digest at first. Once you can tolerate liquid and soft foods, you can begin eating regular solid foods.   Avoid lifting or  driving until you are instructed otherwise.   Make an appointment to see your caregiver for suture (stitches) or staple removal when instructed.   You may use ice on your incision for 15 to 20 minutes, 3 to 4 times per day for the first two days.  SEEK MEDICAL CARE IF:   You see redness, swelling, or have increasing pain in wounds.   You have pus coming from your wound.   You develop an unexplained temperature over 102 F (38.9 C).   You have a bad smell coming from the wound or dressing.   You develop lightheadedness or feel faint.  SEEK IMMEDIATE MEDICAL CARE IF:   You have increased bleeding from wounds.   You have increasing abdominal pain, repeated vomiting, dehydration or fainting.   You develop severe weakness, chest pain or back pain   You develop blood in your vomit, stool or you have tarry stool.   You develop a rash.   You have difficulty breathing.   You develop any reaction or side effects to medicines given.  Document Released: 06/03/2005 Document Revised: 11/03/2011 Document Reviewed: 12/18/2007 [facility] Patient Information 2012 [place], [place].

## 2012-02-02 NOTE — Discharge Summary (Signed)
Physician Discharge Summary  Patient ID: EADIE REPETTO MRN: [id number] DOB/AGE: 1975-12-15 35 y.o.  Admit date: 01/29/2012 Discharge date: 02/02/2012  Admission Diagnoses: Small bowel obstruction  Discharge Diagnoses: Small bowel obstruction, resolved Active Problems:  * No active hospital problems. *    Discharged Condition: good  Hospital Course: Patient is a 36 year old black female presenting with a 4 to five-day history of worsening lower abdominal pain. CT scan of the abdomen and pelvis done in the emergency room revealed a possible bowel obstruction versus ileus. She had a collapsing right ovarian follicular cyst. She had had previous pelvic surgery. She was admitted to the surgery service for further evaluation treatment. A nasogastric tube was placed. Her bowel function did resolve with bowel decompression. Her diet has been advanced at difficulty. The patient is being discharged home on hospital day 4 in good improving condition. She did not require surgical intervention.  Treatments: IV hydration and NG tube placement  Discharge Exam: Blood pressure 123/80, pulse 68, temperature 98.6 F (37 C), temperature source Oral, resp. rate 20, height 5\' 5"  (1.651 m), weight 72.5 kg (159 lb 13.3 oz), last menstrual period 01/08/2012, SpO2 100.00%. General appearance: alert, cooperative and no distress Resp: clear to auscultation bilaterally Cardio: regular rate and rhythm, S1, S2 normal, no murmur, click, rub or gallop GI: soft, non-tender; bowel sounds normal; no masses,  no organomegaly  Disposition: 01-Home or Self Care   Medication List  As of 02/02/2012  8:41 AM   TAKE these medications         dicyclomine 20 MG tablet   Commonly known as: BENTYL   Take 20 mg by mouth every 6 (six) hours as needed. Patient was given this med yesterday by Manuela Neptune, MD      nitrofurantoin 50 MG capsule   Commonly known as: MACRODANTIN   Take one twice a day for 5 days     promethazine 25 MG tablet   Commonly known as: PHENERGAN   Take 0.5 tablets (12.5 mg total) by mouth every 6 (six) hours as needed for nausea.           Follow-up Information    Call Dalia Heading, MD. (As needed)    Contact information:   [address] [place] [address] [phone]          Signed: Dalia Heading 02/02/2012, 8:41 AM

## 2012-02-02 NOTE — Progress Notes (Signed)
PIV removed without complaint, patient discharged home. Patient verbalizes understanding of discharge instructions and follow up care. Patient escorted out by staff, transported by family. 

## 2012-02-27 DIAGNOSIS — K56609 Unspecified intestinal obstruction, unspecified as to partial versus complete obstruction: Secondary | ICD-10-CM

## 2012-02-27 HISTORY — DX: Unspecified intestinal obstruction, unspecified as to partial versus complete obstruction: K56.609

## 2012-07-24 ENCOUNTER — Encounter: Payer: Self-pay | Admitting: Obstetrics and Gynecology

## 2012-07-24 ENCOUNTER — Ambulatory Visit (INDEPENDENT_AMBULATORY_CARE_PROVIDER_SITE_OTHER): Payer: PRIVATE HEALTH INSURANCE | Admitting: Obstetrics and Gynecology

## 2012-07-24 VITALS — BP 124/72 | Temp 99.4°F | Wt 170.0 lb

## 2012-07-24 DIAGNOSIS — Z124 Encounter for screening for malignant neoplasm of cervix: Secondary | ICD-10-CM

## 2012-07-24 DIAGNOSIS — R102 Pelvic and perineal pain: Secondary | ICD-10-CM

## 2012-07-24 DIAGNOSIS — N949 Unspecified condition associated with female genital organs and menstrual cycle: Secondary | ICD-10-CM

## 2012-07-24 DIAGNOSIS — Z01419 Encounter for gynecological examination (general) (routine) without abnormal findings: Secondary | ICD-10-CM

## 2012-07-24 LAB — POCT URINALYSIS DIPSTICK
Bilirubin, UA: NEGATIVE
Glucose, UA: NEGATIVE
Nitrite, UA: NEGATIVE
Spec Grav, UA: <=1.005
pH, UA: 8

## 2012-07-24 LAB — POCT URINE PREGNANCY: Preg Test, Ur: NEGATIVE

## 2012-07-24 NOTE — Patient Instructions (Signed)
Take multivitamin with 400 mcg of folic acid or more daily

## 2012-07-24 NOTE — Progress Notes (Signed)
Regular Periods: yes Mammogram: no  Monthly Breast Ex.: yes Exercise: no  Tetanus < 10 years: yes Seatbelts: yes  NI. Bladder Functn.: yes Abuse at home: no  Daily BM's: yes Stressful Work: no  Healthy Diet: yes Sigmoid-Colonoscopy: NO  Calcium: no Medical problems this year: ? CYST; PT FEELS THE SAME PAIN SHE HAD YEARS AGO AND C/O PAINFUL I/C   LAST PAP:7/11  Contraception: NONE TRYING TO CONCEIVE  Mammogram:  NO  PCP: DR. PANG  PMH: NO CHANGE  FMH: NO CHANGE  Last Bone Scan: NO

## 2012-07-24 NOTE — Progress Notes (Signed)
Subjective:    [name] is a 36 y.o. female, G1P1, who presents for an annual exam. The patient has a history of dermoid cyst with a history of right cystectomy in 11/2010 and a previous left oophorectomy due to dermoid cyst. States she is having the same pain she had then.  Pain is achy x 4 months and is worsening (seems to include her lower back).  Intercourse makes it worse though some relief occurs with position change and on occasion,  can press on supra pubic area and it will be very tender.  Denies urinary tract or bowel symptoms.  Also has not had vaginitis symptoms.  Menstrual cycle:   LMP: Patient's last menstrual period was 07/03/2012.             Review of Systems Pertinent items are noted in HPI. Denies pelvic pain, urinary tract symptoms, vaginitis symptoms, irregular bleeding, menopausal symptoms, change in bowel habits or rectal bleeding   Objective:    BP 124/72  Temp 99.4 F (37.4 C) (Oral)  Wt 170 lb (77.111 kg)  LMP 07/03/2012    Wt Readings from Last 1 Encounters:  07/24/12 170 lb (77.111 kg)   There is no height on file to calculate BMI. General Appearance: Alert, no acute distress HEENT: Grossly normal Neck / Thyroid: Supple, no thyromegaly or cervical adenopathy Lungs: Clear to auscultation bilaterally Back: No CVA tenderness Breast Exam: No masses or nodes.No dimpling, nipple retraction or discharge. Cardiovascular: Regular rate and rhythm.  Gastrointestinal: Soft, non-tender, no masses or organomegaly Pelvic Exam: EGBUS-wnl, vagina-normal rugae, cervix- without lesions or tenderness, uterus appears normal size shape and consistency, adnexae-no masses or tenderness Lymphatic Exam: Non-palpable nodes in neck, clavicular,  axillary, or inguinal regions  Skin: no rashes or abnormalities Extremities: no clubbing cyanosis or edema  Neurologic: grossly normal Psychiatric: Alert and oriented    Urinalysis:negative UPT: negative   Assessment:    Routine GYN Exam Pelvic Pain H/O Dermoid Cysts S/P Left Oophorectomy   Plan:  Pelvic U/S to rule out cyst   PAP sent  RTO-as scheduled  Axell Trigueros,ELMIRAPA-C

## 2012-07-25 LAB — PAP IG W/ RFLX HPV ASCU

## 2012-08-06 ENCOUNTER — Ambulatory Visit (INDEPENDENT_AMBULATORY_CARE_PROVIDER_SITE_OTHER): Payer: PRIVATE HEALTH INSURANCE

## 2012-08-06 ENCOUNTER — Ambulatory Visit (INDEPENDENT_AMBULATORY_CARE_PROVIDER_SITE_OTHER): Payer: PRIVATE HEALTH INSURANCE | Admitting: Obstetrics and Gynecology

## 2012-08-06 ENCOUNTER — Encounter: Payer: Self-pay | Admitting: Obstetrics and Gynecology

## 2012-08-06 VITALS — BP 120/90 | Temp 98.8°F | Wt 170.0 lb

## 2012-08-06 DIAGNOSIS — R102 Pelvic and perineal pain: Secondary | ICD-10-CM

## 2012-08-06 DIAGNOSIS — N949 Unspecified condition associated with female genital organs and menstrual cycle: Secondary | ICD-10-CM

## 2012-08-06 NOTE — Progress Notes (Signed)
36 YO with a history of dermoid cysts and left oophorectomy seen last week for pelvic pain returns for pelvic ultrasound.  O: U/S-uterus 9.19 x 5.53 x 4.04 cm, endometrium 0.485 cm;  right ovary 7.72 x 2.07 x 2.15 cm, left ovary surgically absent.   A: Pelvic Pain     H/O Dermoid Cyst     S/P Oophorectomy  P:  Reviewed causes of pelvic pain: urogenital, previous surgery, gastrointestinal and musculoskeletal.      Follow up with PCP for evaluation of non-gynecologic causes of pelvic pain       Mentioned Laparoscopy of a last resort  after non-invasive evaluation of her pelvic pain has taken place.  Majestic Molony,  PA-C

## 2012-08-06 NOTE — Patient Instructions (Signed)
Reviewed causes of pelvic pain: urogenital, previous surgery, gastrointestinal and musculoskeletal.  

## 2012-09-14 ENCOUNTER — Other Ambulatory Visit: Payer: Self-pay | Admitting: Internal Medicine

## 2012-09-14 ENCOUNTER — Ambulatory Visit
Admission: RE | Admit: 2012-09-14 | Discharge: 2012-09-14 | Disposition: A | Discharge status: Home / Self Care | Payer: PRIVATE HEALTH INSURANCE | Source: Ambulatory Visit | Attending: Internal Medicine | Admitting: Internal Medicine

## 2012-09-14 DIAGNOSIS — R109 Unspecified abdominal pain: Secondary | ICD-10-CM

## 2012-09-14 MED ORDER — IOHEXOL 300 MG/ML  SOLN
100.0000 mL | Freq: Once | INTRAMUSCULAR | Status: AC | PRN
  Administered 2012-09-14: 100 mL via INTRAVENOUS

## 2012-10-15 ENCOUNTER — Encounter: Payer: Self-pay | Admitting: Obstetrics and Gynecology

## 2012-10-15 ENCOUNTER — Ambulatory Visit (INDEPENDENT_AMBULATORY_CARE_PROVIDER_SITE_OTHER): Payer: PRIVATE HEALTH INSURANCE | Admitting: Obstetrics and Gynecology

## 2012-10-15 VITALS — BP 112/72 | Wt 172.0 lb

## 2012-10-15 DIAGNOSIS — N83209 Unspecified ovarian cyst, unspecified side: Secondary | ICD-10-CM

## 2012-10-15 DIAGNOSIS — R102 Pelvic and perineal pain unspecified side: Secondary | ICD-10-CM

## 2012-10-15 DIAGNOSIS — N949 Unspecified condition associated with female genital organs and menstrual cycle: Secondary | ICD-10-CM

## 2012-10-15 DIAGNOSIS — N83299 Other ovarian cyst, unspecified side: Secondary | ICD-10-CM

## 2012-10-15 NOTE — Progress Notes (Signed)
36 YO with a history of a left dermoid cyst and subsequent oophorectomy was seen at [facility] 08/06/12 for pelvic pain.  Ultrasound at that time was normal but patient continued to have episodic (primarily) right sided pelvic pain with no alleviating or aggravating factors.  Was seen by PCP who ordered a CT of Abd/Pelvis (09/14/12) that showed a right corpus luteum cyst (2.2 cm).  She's here for follow up.   O: Abdomen: soft, mild tenderness without guarding in right lower quadrant      Pelvic: EGBUS-wnl, vagina-normal mucosa, cervix-no lesions, uterus-normal size without tenderness, right adnexa-tender without      palpable masses, left adnexa-no tenderness or masses  A:  Episodic Right Sided Pelvic Pain       Functional Right Ovarian Cyst (2.2 cm)       H/O Dermoid Cyst       S/P Left Oophorectomy due to  dermoid cyst  P;  Repeat ultrasound         F/U with M.D. after ultrasound for further evaluation of pelvic pain       Reviewed laparoscopy as a next step in evaluation        Reviewed causes of pelvic pain: urogenital, previous surgery, gastrointestinal and musculoskeletal.        RTO-as scheduled or prn  Nahima Ales, PA-C

## 2012-11-06 ENCOUNTER — Encounter: Payer: PRIVATE HEALTH INSURANCE | Admitting: Obstetrics and Gynecology

## 2012-11-06 ENCOUNTER — Other Ambulatory Visit: Payer: PRIVATE HEALTH INSURANCE

## 2012-12-25 ENCOUNTER — Telehealth: Payer: Self-pay | Admitting: Obstetrics and Gynecology

## 2012-12-26 ENCOUNTER — Ambulatory Visit: Payer: PRIVATE HEALTH INSURANCE

## 2012-12-26 ENCOUNTER — Ambulatory Visit: Payer: PRIVATE HEALTH INSURANCE | Admitting: Obstetrics and Gynecology

## 2012-12-26 ENCOUNTER — Encounter: Payer: Self-pay | Admitting: Obstetrics and Gynecology

## 2012-12-26 VITALS — BP 118/64 | Wt 173.0 lb

## 2012-12-26 DIAGNOSIS — R102 Pelvic and perineal pain: Secondary | ICD-10-CM

## 2012-12-26 DIAGNOSIS — N9089 Other specified noninflammatory disorders of vulva and perineum: Secondary | ICD-10-CM

## 2012-12-26 DIAGNOSIS — N83299 Other ovarian cyst, unspecified side: Secondary | ICD-10-CM

## 2012-12-26 DIAGNOSIS — E739 Lactose intolerance, unspecified: Secondary | ICD-10-CM

## 2012-12-26 DIAGNOSIS — R1031 Right lower quadrant pain: Secondary | ICD-10-CM

## 2012-12-26 NOTE — Progress Notes (Signed)
GYN PROBLEM VISIT  Ms. [name] is a 37 y.o. year old female,G1P1, who presents for a problem visit.   Subjective: She presents for followup after U/S to follow a right ovarian cyst noted on CT scan 11/13.  The pt continues to have almost daily bilateral and central pelvic pain that also radiates to her back.  She gets some relief from flatus.  She has chronic constipation with as few as one BM every two weeks.  She notes pelvic pain associated with the ingestion of milk products. She has almost daily nausea, but no vomiting.  She denies urinary sx. She was hospitalized for 5 days in 3/13 for SBO which resolved with conservative treatment and did not require surgery.  Objective:  BP 118/64  Wt 173 lb (78.472 kg)  LMP 11/26/2012   ULTRASOUND: Uterus: Length: 6.49 cm   Width:  5.09 cm   Height:  4.57 cm  Endo thickness:  0.720 cm   Left ovary:Absent Right ovary:Normal Fibroids:no CDS fluid:yes small WNLs   Assessment: Chronic pelvic pain Probable lactose intolerance Hx SBO resolved spontaneously Right ovarian cyst resolved S/P left oophorectomy Chronic constipation Hx recurrent vulvar "pimples"  Plan: D/C all milk products. Use Lactaid for foods with milk that cannot be avoided Continue probiotics and colace Pt request HSV serology Return to office in 4 week(s).   Dierdre Forth, MD  12/26/2012 3:07 PM

## 2012-12-26 NOTE — Patient Instructions (Signed)
Lactose Intolerance, Adult Lactose intolerance is when the body is not able to digest lactose, a sugar found in milk and milk products. Lactose intolerance is caused by your body not producing enough of the enzyme lactase. When there is not enough lactase to digest the amount of lactose consumed, discomfort may be felt. Lactose intolerance is not a milk allergy. For most people, lactase deficiency is a condition that develops naturally over time. After about the age of 2, the body begins to produce less lactase. But many people may not experience symptoms until they are much older. CAUSES Things that can cause you to be lactose intolerant include:  Aging.  Being born without the ability to make lactase.  Certain digestive diseases.  Injuries to the small intestine. SYMPTOMS   Feeling sick to your stomach (nauseous).  Diarrhea.  Cramps.  Bloating.  Gas. Symptoms usually show up a half hour or 2 hours after eating or drinking products containing lactose. TREATMENT  No treatment can improve the body's ability to produce lactase. However, symptoms can be controlled through diet. A medicine may be given to you to take when you consume lactose-containing foods or drinks. The medicine contains the lactase enzyme, which help the body digest lactose better. HOME CARE INSTRUCTIONS  Eat or drink dairy products as told by your caregiver or dietician.  Take all medicine as directed by your caregiver.  Find lactose-free or lactose-reduced products at your local grocery store.  Talk to your caregiver or dietician to decide if you need any dietary supplements. The following is the amount of calcium needed from the diet:  19 to 50 years: 1000 mg  Over 50 years: 1200 mg Calcium and Lactose in Common Foods Non-Dairy Products / Calcium Content (mg)  Calcium-fortified orange juice, 1 cup / 308 to 344 mg  Sardines, with edible bones, 3 oz / 270 mg  Salmon, canned, with edible bones, 3 oz /  205 mg  Soymilk, fortified, 1 cup / 200 mg  Broccoli (raw), 1 cup / 90 mg  Orange, 1 medium / 50 mg  Pinto beans,  cup / 40 mg  Tuna, canned, 3 oz / 10 mg  Lettuce greens,  cup / 10 mg Dairy Products / Calcium Content (mg) / Lactose Content (g)  Yogurt, plain, low-fat, 1 cup / 415 mg / 5 g  Milk, reduced fat, 1 cup / 295 mg / 11 g  Swiss cheese, 1 oz / 270 mg / 1 g  Ice cream,  cup / 85 mg / 6 g  Cottage cheese,  cup / 75 mg / 2 to 3 g SEEK MEDICAL CARE IF: You have no relief from your symptoms. Document Released: 11/14/2005 Document Revised: 02/06/2012 Document Reviewed: 02/11/2011 [facility] Patient Information 2013 [place], [place].  Use OTC Lactaid for foods with milk that you cannot avoid

## 2013-01-14 ENCOUNTER — Other Ambulatory Visit: Payer: Self-pay | Admitting: Internal Medicine

## 2013-01-23 ENCOUNTER — Encounter: Payer: Self-pay | Admitting: Obstetrics and Gynecology

## 2013-01-23 ENCOUNTER — Ambulatory Visit: Payer: PRIVATE HEALTH INSURANCE | Admitting: Obstetrics and Gynecology

## 2013-01-23 VITALS — BP 118/78 | HR 72 | Wt 176.0 lb

## 2013-01-23 DIAGNOSIS — B009 Herpesviral infection, unspecified: Secondary | ICD-10-CM

## 2013-01-23 DIAGNOSIS — R102 Pelvic and perineal pain: Secondary | ICD-10-CM

## 2013-01-23 DIAGNOSIS — N979 Female infertility, unspecified: Secondary | ICD-10-CM

## 2013-01-23 DIAGNOSIS — E739 Lactose intolerance, unspecified: Secondary | ICD-10-CM

## 2013-01-23 HISTORY — DX: Herpesviral infection, unspecified: B00.9

## 2013-01-23 LAB — PROLACTIN: Prolactin: 8.8 ng/mL

## 2013-01-23 MED ORDER — VALACYCLOVIR HCL 500 MG PO TABS: ORAL_TABLET | ORAL | Status: DC

## 2013-01-23 NOTE — Progress Notes (Signed)
Subjective:    [name] is a 37 y.o. female, G1P1, who presents for f/u on labs done on 12/26/2012 and pelvic pains. The pt has restricted all dairy and taken Align with resolution of pain until she had a milkshake last night and the pain recurred. She has also been attempting a second pregnancy without success She has had 2 HSV outbreaks   The following portions of the patient's history were reviewed and updated as appropriate: allergies, current medications, past family history.  Review of Systems Pertinent items are noted in HPI. Breast:Negative for breast lump,nipple discharge or nipple retraction Gastrointestinal: Negative for abdominal pain, change in bowel habits or rectal bleeding Urinary:negative   Objective:    BP 118/78  Pulse 72  Wt 176 lb (79.833 kg)  BMI 29.29 kg/m2  LMP 01/25/2012    Weight:  Wt Readings from Last 1 Encounters:  01/23/13 176 lb (79.833 kg)          BMI: Body mass index is 29.29 kg/(m^2).     Assessment:    Lactose intolerance  Infertility secondary HSV II   Plan:   TSH, prolactin today F/u 3 mos if no pregnancy Valtrex prn aex 06/2013

## 2013-01-23 NOTE — Patient Instructions (Addendum)
L-lysine 100mg  daily Lactaid (lactase enzyme) for any milk products

## 2013-10-03 ENCOUNTER — Other Ambulatory Visit: Payer: Self-pay

## 2013-11-28 NOTE — L&D Delivery Note (Signed)
Delivery Note At 12:22 AM a viable female, "[name]", was delivered via Vaginal, Spontaneous Delivery (Presentation: Right Occiput Anterior).  APGAR: 9, 9; weight .   Placenta status: Intact, Spontaneous.  Cord: 3 vessels with the following complications: Cord around shoulders.  Cord pH: NA  Anesthesia: Epidural Local  Episiotomy: None Lacerations: 2nd degree;Perineal Suture Repair: 3.0 vicryl Est. Blood Loss (mL): 300  Mom to postpartum.  Baby to Couplet care / Skin to Skin. Family plans inpatient circumcision.  Cherylin Waguespack, [place] 07/02/2014, 12:54 AM

## 2013-12-25 LAB — OB RESULTS CONSOLE RUBELLA ANTIBODY, IGM: Rubella: IMMUNE

## 2013-12-25 LAB — OB RESULTS CONSOLE HEPATITIS B SURFACE ANTIGEN: Hepatitis B Surface Ag: NEGATIVE

## 2013-12-25 LAB — OB RESULTS CONSOLE GC/CHLAMYDIA
Chlamydia: NEGATIVE
GC PROBE AMP, GENITAL: NEGATIVE

## 2013-12-25 LAB — OB RESULTS CONSOLE RPR: RPR: NONREACTIVE

## 2013-12-25 LAB — OB RESULTS CONSOLE HIV ANTIBODY (ROUTINE TESTING): HIV: NONREACTIVE

## 2013-12-25 LAB — OB RESULTS CONSOLE ABO/RH: RH TYPE: POSITIVE

## 2013-12-25 LAB — OB RESULTS CONSOLE ANTIBODY SCREEN: Antibody Screen: NEGATIVE

## 2014-02-22 IMAGING — CT CT ABD-PELV W/ CM
2 of 4 series · 13 of 32 positions shown, 18 images · IV contrast (30CC OMNI 300 & [ID] OMNI 300)
Comparison: 01/29/2012

CLINICAL DATA: Lower abdominopelvic pain, nausea, history of bowel
obstruction

CT ABDOMEN AND PELVIS WITH CONTRAST
TECHNIQUE: Multidetector CT imaging of the abdomen and pelvis was
performed following the standard protocol during bolus
administration of intravenous contrast.
Contrast: 100mL OMNIPAQUE IOHEXOL 300 MG/ML  SOLN

[Series 2: abd/pelvis with · axial · 0.70mm/px · z∈[-308,-78]mm · 5 of 69 slices shown, 10 images]
[im 12/69  soft-tissue]
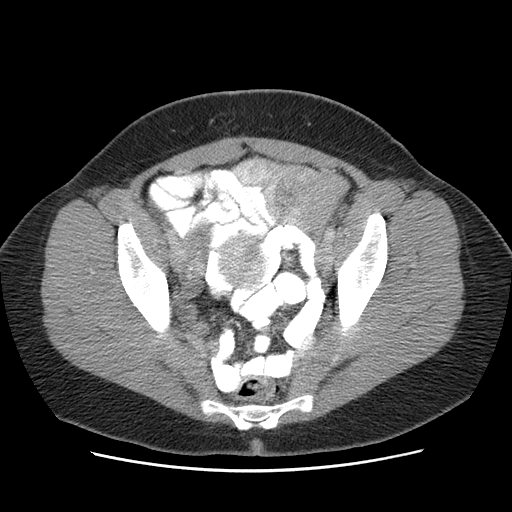
[im 12/69  bone]
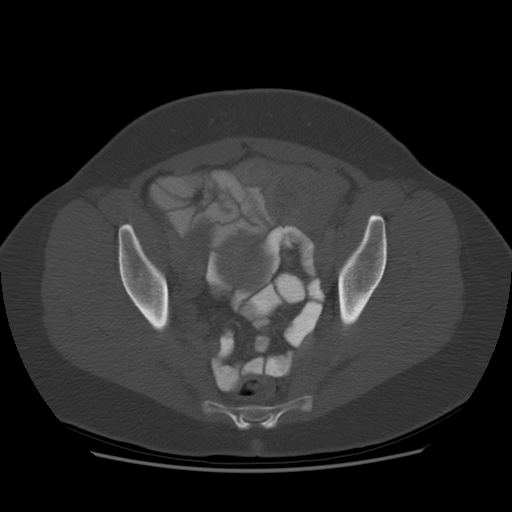
[im 23/69  soft-tissue]
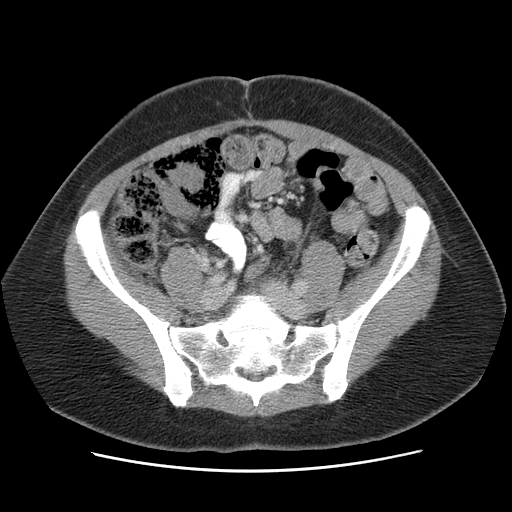
[im 23/69  lung]
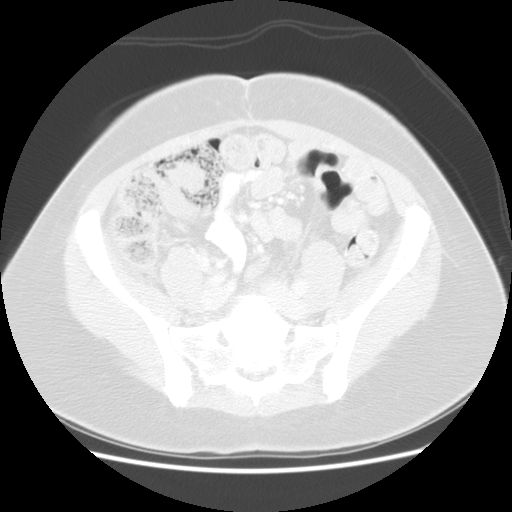
[im 35/69  soft-tissue]
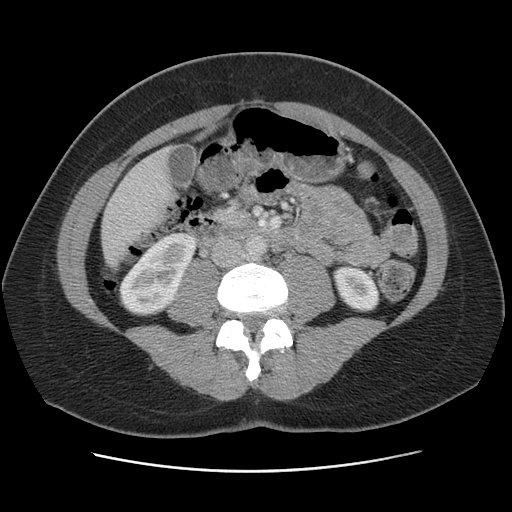
[im 35/69  lung]
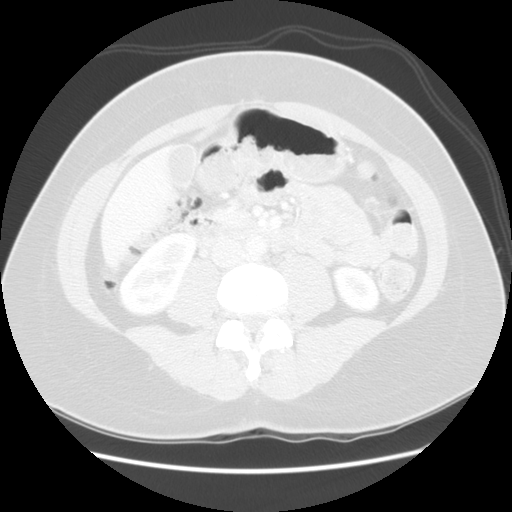
[im 46/69  soft-tissue]
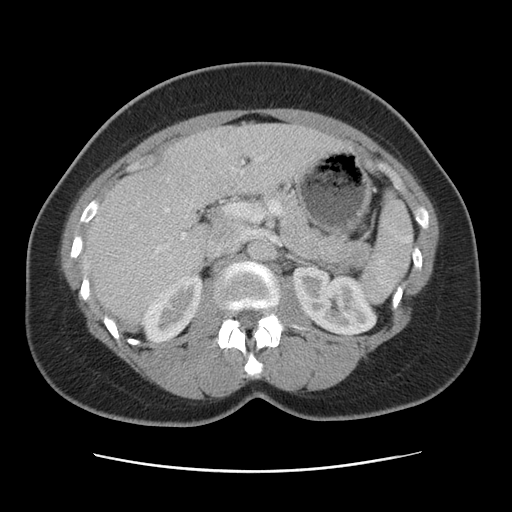
[im 46/69  lung]
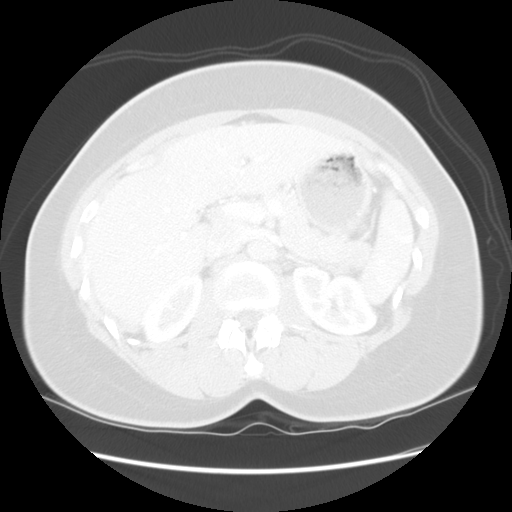
[im 57/69  soft-tissue]
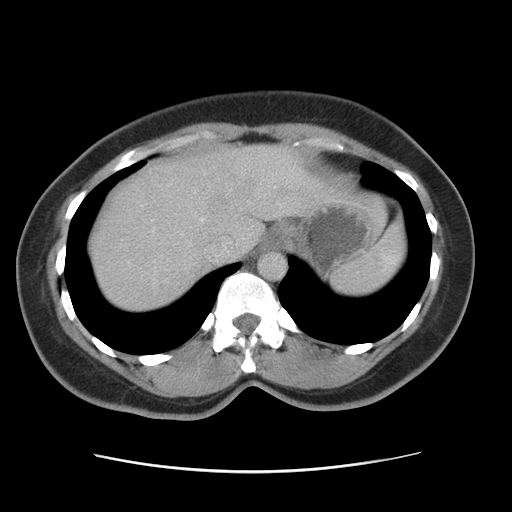
[im 57/69  lung]
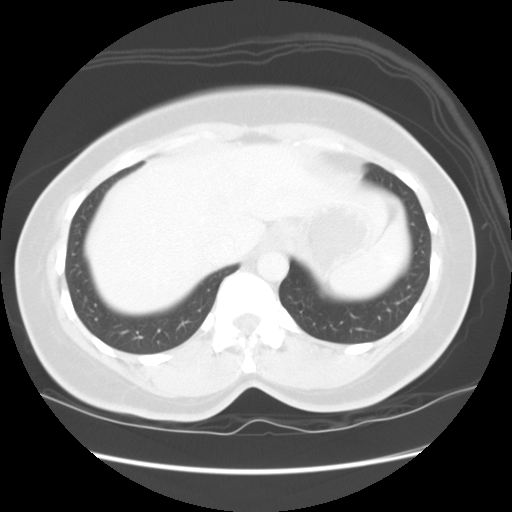

[Series 400: sag · sagittal · 0.79mm/px · 8 of 140 slices shown]
[im 12/140  soft-tissue]
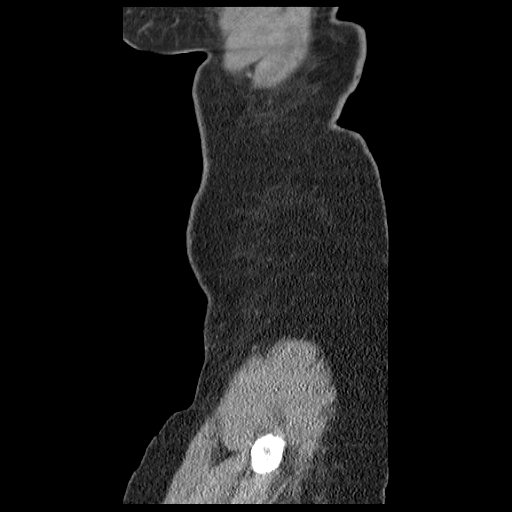
[im 35/140  soft-tissue]
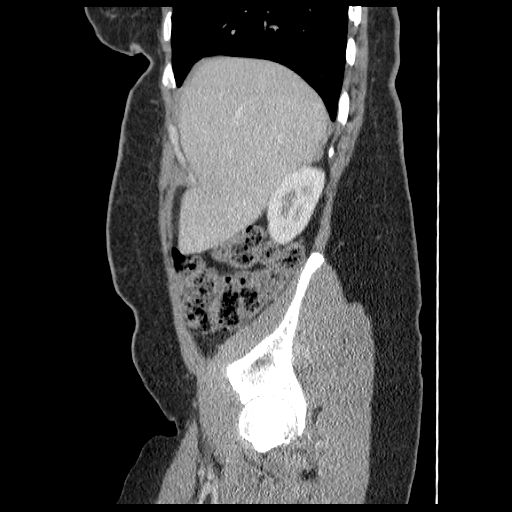
[im 47/140  soft-tissue]
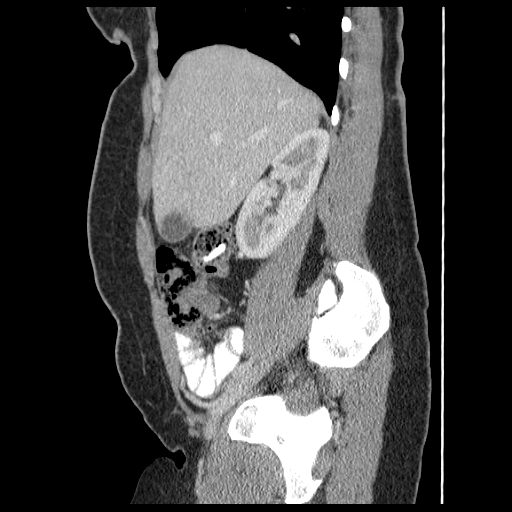
[im 58/140  soft-tissue]
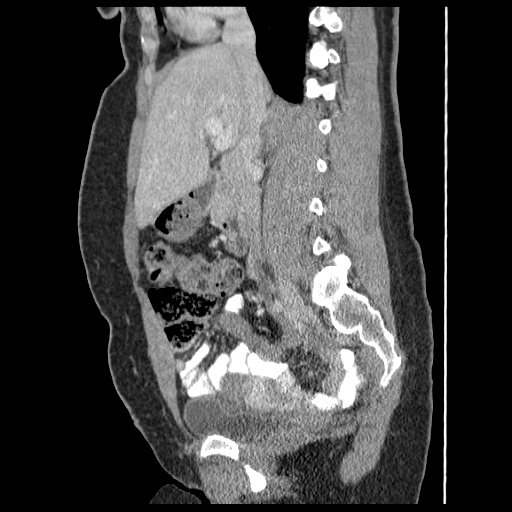
[im 82/140  soft-tissue]
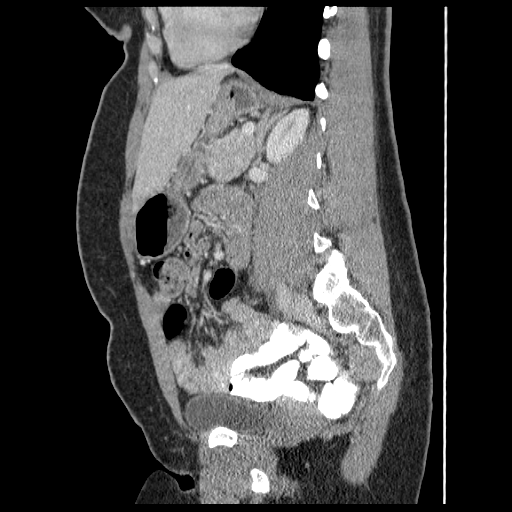
[im 93/140  soft-tissue]
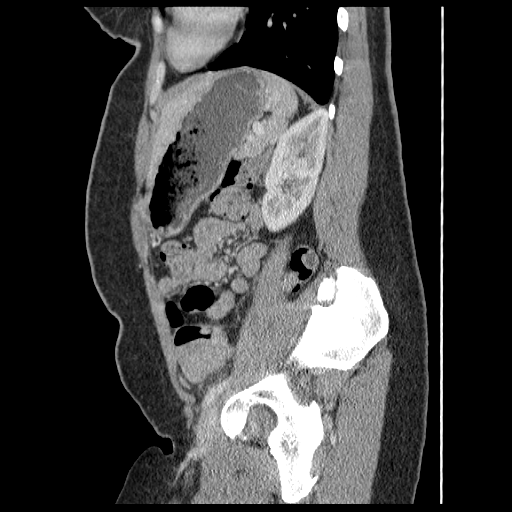
[im 105/140  soft-tissue]
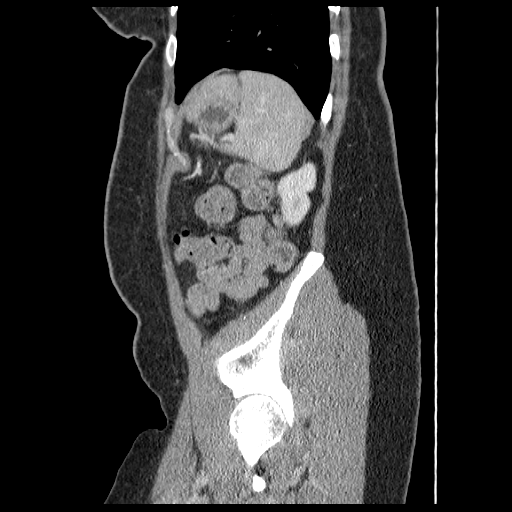
[im 128/140  soft-tissue]
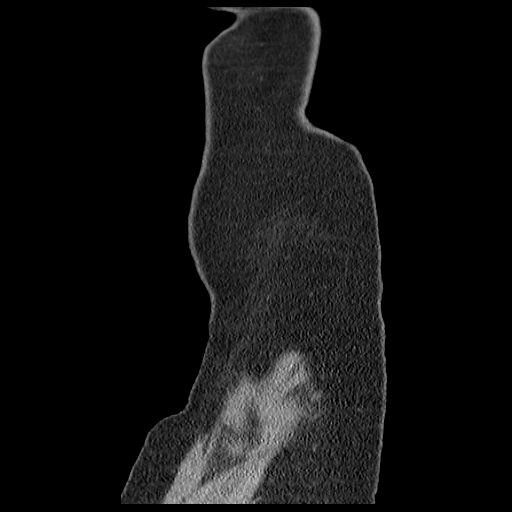

[13 of 32 positions shown; findings below may reference images not displayed]

FINDINGS: Lung bases are clear.

Liver, spleen, pancreas, and adrenal glands are within normal
limits.

Gallbladder is underdistended.  No intrahepatic or extrahepatic
ductal dilatation.

Kidneys are within normal limits.  No hydronephrosis.

No evidence of bowel obstruction.  Normal appendix.

No evidence of abdominal aortic aneurysm.

No abdominopelvic ascites.

No suspicious abdominopelvic lymphadenopathy.

Uterus is unremarkable.  Right ovary is notable for a 2.2 cm corpus
luteal cyst.  No left adnexal mass.

Bladder is within normal limits.

Calcified pelvic phleboliths.

Visualized osseous structures are within normal limits.
IMPRESSION: No evidence of bowel obstruction.  Normal appendix.

2.2 cm right corpus luteal cyst.

Otherwise, no CT findings to account for the patient's lower
abdominopelvic pain.

## 2014-06-10 LAB — OB RESULTS CONSOLE GBS: GBS: POSITIVE

## 2014-07-01 ENCOUNTER — Inpatient Hospital Stay (HOSPITAL_COMMUNITY)
Admission: AD | Admit: 2014-07-01 | Discharge: 2014-07-04 | DRG: 774 | Disposition: A | Discharge status: Home / Self Care | Payer: 59 | Source: Ambulatory Visit | Attending: Obstetrics and Gynecology | Admitting: Obstetrics and Gynecology

## 2014-07-01 ENCOUNTER — Encounter (HOSPITAL_COMMUNITY): Payer: Self-pay | Admitting: *Deleted

## 2014-07-01 ENCOUNTER — Inpatient Hospital Stay (HOSPITAL_COMMUNITY): Payer: 59 | Admitting: Anesthesiology

## 2014-07-01 ENCOUNTER — Encounter (HOSPITAL_COMMUNITY): Payer: 59 | Admitting: Anesthesiology

## 2014-07-01 DIAGNOSIS — N83209 Unspecified ovarian cyst, unspecified side: Secondary | ICD-10-CM | POA: Diagnosis present

## 2014-07-01 DIAGNOSIS — A6000 Herpesviral infection of urogenital system, unspecified: Secondary | ICD-10-CM | POA: Diagnosis present

## 2014-07-01 DIAGNOSIS — K56609 Unspecified intestinal obstruction, unspecified as to partial versus complete obstruction: Secondary | ICD-10-CM

## 2014-07-01 DIAGNOSIS — B009 Herpesviral infection, unspecified: Secondary | ICD-10-CM | POA: Diagnosis present

## 2014-07-01 DIAGNOSIS — O34599 Maternal care for other abnormalities of gravid uterus, unspecified trimester: Secondary | ICD-10-CM | POA: Diagnosis present

## 2014-07-01 DIAGNOSIS — B029 Zoster without complications: Secondary | ICD-10-CM | POA: Diagnosis not present

## 2014-07-01 DIAGNOSIS — O98519 Other viral diseases complicating pregnancy, unspecified trimester: Secondary | ICD-10-CM | POA: Diagnosis present

## 2014-07-01 DIAGNOSIS — O99820 Streptococcus B carrier state complicating pregnancy: Secondary | ICD-10-CM

## 2014-07-01 DIAGNOSIS — Z833 Family history of diabetes mellitus: Secondary | ICD-10-CM

## 2014-07-01 DIAGNOSIS — Z8249 Family history of ischemic heart disease and other diseases of the circulatory system: Secondary | ICD-10-CM

## 2014-07-01 DIAGNOSIS — N83201 Unspecified ovarian cyst, right side: Secondary | ICD-10-CM | POA: Diagnosis not present

## 2014-07-01 DIAGNOSIS — Z22330 Carrier of Group B streptococcus: Secondary | ICD-10-CM | POA: Diagnosis not present

## 2014-07-01 DIAGNOSIS — O99892 Other specified diseases and conditions complicating childbirth: Secondary | ICD-10-CM | POA: Diagnosis present

## 2014-07-01 DIAGNOSIS — O4290 Premature rupture of membranes, unspecified as to length of time between rupture and onset of labor, unspecified weeks of gestation: Secondary | ICD-10-CM | POA: Diagnosis present

## 2014-07-01 DIAGNOSIS — O09529 Supervision of elderly multigravida, unspecified trimester: Secondary | ICD-10-CM | POA: Diagnosis present

## 2014-07-01 DIAGNOSIS — O99891 Other specified diseases and conditions complicating pregnancy: Secondary | ICD-10-CM | POA: Diagnosis present

## 2014-07-01 DIAGNOSIS — O9989 Other specified diseases and conditions complicating pregnancy, childbirth and the puerperium: Secondary | ICD-10-CM

## 2014-07-01 HISTORY — DX: Herpesviral infection, unspecified: B00.9

## 2014-07-01 HISTORY — DX: Zoster without complications: B02.9

## 2014-07-01 HISTORY — DX: Unspecified intestinal obstruction, unspecified as to partial versus complete obstruction: K56.609

## 2014-07-01 LAB — CBC
HCT: 34.8 % — ABNORMAL LOW (ref 36.0–46.0)
HEMOGLOBIN: 11.7 g/dL — AB (ref 12.0–15.0)
MCH: 29.6 pg (ref 26.0–34.0)
MCHC: 33.6 g/dL (ref 30.0–36.0)
MCV: 88.1 fL (ref 78.0–100.0)
Platelets: 120 10*3/uL — ABNORMAL LOW (ref 150–400)
RBC: 3.95 MIL/uL (ref 3.87–5.11)
RDW: 15 % (ref 11.5–15.5)
WBC: 7.8 10*3/uL (ref 4.0–10.5)

## 2014-07-01 LAB — ABO/RH: ABO/RH(D): A POS

## 2014-07-01 LAB — SAMPLE TO BLOOD BANK

## 2014-07-01 LAB — TYPE AND SCREEN
ABO/RH(D): A POS
Antibody Screen: NEGATIVE

## 2014-07-01 LAB — AMNISURE RUPTURE OF MEMBRANE (ROM) NOT AT ARMC: Amnisure ROM: POSITIVE

## 2014-07-01 LAB — RPR

## 2014-07-01 MED ORDER — ZOLPIDEM TARTRATE 5 MG PO TABS: 5.0000 mg | ORAL_TABLET | Freq: Every evening | ORAL | Status: DC | PRN

## 2014-07-01 MED ORDER — ONDANSETRON HCL 4 MG/2ML IJ SOLN
4.0000 mg | Freq: Four times a day (QID) | INTRAMUSCULAR | Status: DC | PRN
  Administered 2014-07-02: 4 mg via INTRAVENOUS
  Filled 2014-07-01: qty 2

## 2014-07-01 MED ORDER — OXYTOCIN 40 UNITS IN LACTATED RINGERS INFUSION - SIMPLE MED
1.0000 m[IU]/min | INTRAVENOUS | Status: DC
  Administered 2014-07-01: 1 m[IU]/min via INTRAVENOUS

## 2014-07-01 MED ORDER — PHENYLEPHRINE 40 MCG/ML (10ML) SYRINGE FOR IV PUSH (FOR BLOOD PRESSURE SUPPORT)
80.0000 ug | PREFILLED_SYRINGE | INTRAVENOUS | Status: DC | PRN
  Filled 2014-07-01: qty 2
  Filled 2014-07-01: qty 10

## 2014-07-01 MED ORDER — LACTATED RINGERS IV SOLN
500.0000 mL | INTRAVENOUS | Status: DC | PRN
  Administered 2014-07-02: 500 mL via INTRAVENOUS

## 2014-07-01 MED ORDER — OXYCODONE-ACETAMINOPHEN 5-325 MG PO TABS: 1.0000 | ORAL_TABLET | ORAL | Status: DC | PRN

## 2014-07-01 MED ORDER — PENICILLIN G POTASSIUM 5000000 UNITS IJ SOLR
2.5000 10*6.[IU] | INTRAVENOUS | Status: DC
  Administered 2014-07-01 (×2): 2.5 10*6.[IU] via INTRAVENOUS
  Filled 2014-07-01 (×7): qty 2.5

## 2014-07-01 MED ORDER — FENTANYL 2.5 MCG/ML BUPIVACAINE 1/10 % EPIDURAL INFUSION (WH - ANES)
INTRAMUSCULAR | Status: DC | PRN
  Administered 2014-07-01: 14 mL/h via EPIDURAL

## 2014-07-01 MED ORDER — OXYTOCIN 40 UNITS IN LACTATED RINGERS INFUSION - SIMPLE MED
1.0000 m[IU]/min | INTRAVENOUS | Status: DC
  Filled 2014-07-01: qty 1000

## 2014-07-01 MED ORDER — CITRIC ACID-SODIUM CITRATE 334-500 MG/5ML PO SOLN: 30.0000 mL | ORAL | Status: DC | PRN

## 2014-07-01 MED ORDER — LIDOCAINE HCL (PF) 1 % IJ SOLN
30.0000 mL | INTRAMUSCULAR | Status: DC | PRN
  Administered 2014-07-02: 30 mL via SUBCUTANEOUS
  Filled 2014-07-01: qty 30

## 2014-07-01 MED ORDER — ACETAMINOPHEN 325 MG PO TABS: 650.0000 mg | ORAL_TABLET | ORAL | Status: DC | PRN

## 2014-07-01 MED ORDER — DIPHENHYDRAMINE HCL 50 MG/ML IJ SOLN: 12.5000 mg | INTRAMUSCULAR | Status: DC | PRN

## 2014-07-01 MED ORDER — IBUPROFEN 600 MG PO TABS: 600.0000 mg | ORAL_TABLET | Freq: Four times a day (QID) | ORAL | Status: DC | PRN

## 2014-07-01 MED ORDER — PHENYLEPHRINE 40 MCG/ML (10ML) SYRINGE FOR IV PUSH (FOR BLOOD PRESSURE SUPPORT)
80.0000 ug | PREFILLED_SYRINGE | INTRAVENOUS | Status: DC | PRN
  Filled 2014-07-01: qty 2

## 2014-07-01 MED ORDER — TERBUTALINE SULFATE 1 MG/ML IJ SOLN: 0.2500 mg | Freq: Once | INTRAMUSCULAR | Status: AC | PRN

## 2014-07-01 MED ORDER — LIDOCAINE HCL (PF) 1 % IJ SOLN
INTRAMUSCULAR | Status: DC | PRN
Start: 2014-07-01 — End: 2014-07-02
  Administered 2014-07-01: 8 mL
  Administered 2014-07-01: 7 mL

## 2014-07-01 MED ORDER — OXYTOCIN 40 UNITS IN LACTATED RINGERS INFUSION - SIMPLE MED: 62.5000 mL/h | INTRAVENOUS | Status: DC

## 2014-07-01 MED ORDER — FENTANYL CITRATE 0.05 MG/ML IJ SOLN
100.0000 ug | INTRAMUSCULAR | Status: DC | PRN
  Administered 2014-07-01: 100 ug via INTRAVENOUS
  Filled 2014-07-01: qty 2

## 2014-07-01 MED ORDER — EPHEDRINE 5 MG/ML INJ
10.0000 mg | INTRAVENOUS | Status: DC | PRN
  Filled 2014-07-01: qty 2

## 2014-07-01 MED ORDER — OXYTOCIN BOLUS FROM INFUSION
500.0000 mL | INTRAVENOUS | Status: DC
  Administered 2014-07-02: 500 mL via INTRAVENOUS

## 2014-07-01 MED ORDER — PENICILLIN G POTASSIUM 5000000 UNITS IJ SOLR
5.0000 10*6.[IU] | Freq: Once | INTRAVENOUS | Status: AC
  Administered 2014-07-01: 5 10*6.[IU] via INTRAVENOUS
  Filled 2014-07-01: qty 5

## 2014-07-01 MED ORDER — LACTATED RINGERS IV SOLN: 500.0000 mL | Freq: Once | INTRAVENOUS | Status: DC

## 2014-07-01 MED ORDER — LACTATED RINGERS IV SOLN
INTRAVENOUS | Status: DC
  Administered 2014-07-01 – 2014-07-02 (×3): via INTRAVENOUS

## 2014-07-01 MED ORDER — EPHEDRINE 5 MG/ML INJ
10.0000 mg | INTRAVENOUS | Status: DC | PRN
  Filled 2014-07-01: qty 4
  Filled 2014-07-01: qty 2

## 2014-07-01 MED ORDER — MISOPROSTOL 25 MCG QUARTER TABLET
25.0000 ug | ORAL_TABLET | ORAL | Status: DC | PRN
  Filled 2014-07-01: qty 1

## 2014-07-01 MED ORDER — FENTANYL 2.5 MCG/ML BUPIVACAINE 1/10 % EPIDURAL INFUSION (WH - ANES)
14.0000 mL/h | INTRAMUSCULAR | Status: DC | PRN
  Administered 2014-07-01 (×2): 14 mL/h via EPIDURAL
  Filled 2014-07-01 (×2): qty 125

## 2014-07-01 NOTE — Progress Notes (Signed)
Labor Progress  Subjective: Comfortable with the epidural  Objective: BP 95/53  Pulse 66  Temp(Src) 97.9 F (36.6 C) (Oral)  Resp 18  Ht 5\' 6"  (1.676 m)  Wt 230 lb (104.327 kg)  BMI 37.14 kg/m2  SpO2 100%      FHT: Category CTX:   regular, every 4-6 minutes Uterus gravid, soft non tender SVE:   Dilation: 5.5 Effacement (%): 80 Station: 0 Exam by:: foley,rn Extremities: WNL  Assessment:  IUP at 39.0 weeks Membranes: SROM clear  Cervix: 5-6/80/0 Labor progress:  active  Plan: Continue labor plan Ambulate Continuous monitoring Anticipate SVD   [name], CNM, MSN 07/01/2014. 6:22 PM

## 2014-07-01 NOTE — H&P (Signed)
[name] is a 38 y.o. female, G2 P1 at 39.0 weeks term IUP SROM at 0800 this morning  Patient Active Problem List   Diagnosis Date Noted  . Female pelvic pain 01/23/2013  . Lactose intolerance 01/23/2013  . Infertility, female 01/23/2013  . 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   EDC of 07/08/14 was established by [place] at 9.1 weeks.   Anatomy scan:  n/a weeks, with normal findings and an anterior placenta.   Additional [place] evaluations:  9.1 weeks      12.4 viability      31.6 weeks growth, 5lbs 7.13 oz     36.0 weeks growth, 7lbs 7oz   Significant prenatal events:  S>D Last evaluation:  38.6 weeks   VE:2/70/-3 on 06/23/14 at 37.6 weeks  Reason for admission: PROM  Pt States:     Contractions Frequency: occassional     Contraction severity: mild     Fetal activity: +FM   OB History   Grav Para Term Preterm Abortions TAB SAB Ect Mult Living   2 1        1      Past Medical History  Diagnosis Date  . Dermoid cyst     h/o  . Bowel obstruction 4/13  . Hx of ovarian cyst    Past Surgical History  Procedure Laterality Date  . Oophorectomy      left?  . Ovarian cyst surgery      LAP CYSTECTOMY   Family History: family history includes Diabetes in her maternal grandmother; Hypertension in her father, maternal grandfather, maternal grandmother, and mother; Ovarian cysts in her maternal grandmother and sister. Social History:  reports that she has never smoked. She has never used smokeless tobacco. She reports that she does not drink alcohol or use illicit drugs.   Prenatal Transfer Tool  Maternal Diabetes: No Genetic Screening: Normal Maternal Ultrasounds/Referrals: Normal Fetal Ultrasounds or other Referrals:  None Maternal Substance Abuse:  No Significant Maternal Medications:  None Significant Maternal Lab Results: Lab values include: Group B Strep positive   ROS:  See HPI above, all other systems are  negative  No Known Allergies   Dilation: 3.5 Effacement (%): 80 Station: -2 Exam by:: Lauralee Evener, rn Blood pressure 116/69, pulse 75, temperature 98.1 F (36.7 C), temperature source Oral, resp. rate 18, SpO2 100.00%.   Maternal Exam:  Uterine Assessment: Contraction frequency is rare.  Abdomen: Gravid, non tender. Fundal height is aga.  Normal external genitalia, vulva, cervix, uterus and adnexa.  No lesions noted on exam.  Pelvis adequate for delivery. Fetal presentation: Vertex by Leopold's   Fetal Exam:  Fetal Monitor Surveillance : Continuous Monitoring Mode: Ultrasound.  FHR: Catagory  1 CTXs: Q 3-4 minutes EFW   8.5lbs   Physical Exam: Nursing note and vitals reviewed General: alert and cooperative She appears well nourished.  Psychiatric: Normal mood and affect. Her behavior is normal.  Head: Normocephalic.  Eyes: Pupils are equal, round, and reactive to light.  Neck: Normal range of motion.  Cardiovascular: RRR without murmur.  Respiratory: CTAB. Effort normal.  Abd: soft, non-tender, +BS, no rebound, no guarding  Genitourinary: Vagina normal.  Musculoskeletal: Normal range of motion.  Ext: WNL, No evidence of DVT seen on physical exam. Homan's sign negative bilaterally Minimal bilaterally non-pitting edema Neurological: A&Ox3.  Normal reflexes.  Skin: Warm and dry.    Prenatal labs: ABO, Rh:  A+ Antibody:  neg Rubella:  immune RPR:   NR HBsAg:   neg HIV:   neg GBS: Positive (07/14 0000) Sickle cell/Hgb electrophoresis:  WNL Pap:  07/24/14 wnl GC:  neg Chlamydia:  neg Genetic screenings:  Low risk Glucola:  neg   Assessment IUP at 39.0 Weeks Membrane: PROM 07/01/14 0800 GBS positive Desires Epidural   Plan: Admit to L&D for expectant management of labor. Possible augmentation options reviewed including foley bulb, AROM and/or pitocin.  Routine L&D orders Regular diet prior to starting pitocin Clear/Thin diet after pitocin  starts Anticipate SVD  IV pain medication per orders Epidural per patient request Foley cath after patient is comfortable with epidural GBS prophylaxis with PCN G per Sakib Noguez dosing    Dotti Busey, CNM, MSN 07/01/2014, 11:10 AM

## 2014-07-01 NOTE — MAU Note (Signed)
Pt got up @ 0715 to use restroom, noted pink mucus.  Felt gush around 0745, noted clear fluid.  Felt another gush later & continues to feel wet now, wearing a pad.  uc's started after gush, q 20 minutes, denies frank bleeding.

## 2014-07-01 NOTE — Progress Notes (Signed)
  Subjective: Feeling some pain with UCs, but no urge to push.  Objective: BP 129/76  Pulse 82  Temp(Src) 97.9 F (36.6 C) (Oral)  Resp 18  Ht 5\' 6"  (1.676 m)  Wt 230 lb (104.327 kg)  BMI 37.14 kg/m2  SpO2 100%      FHT: Category 2--sporadic mild variables, moderate variability UC:   regular, every 2-3 minutes SVE:   Dilation: 10 Effacement (%): 100 Station: 0 Exam by:: v. Gaia Gullikson, cnm Pitocin at 2 mu/min  Assessment:  2nd stage labor  Plan: Will await urge to push.  Donnel Saxon CNM 07/01/2014, 11:45 PM

## 2014-07-01 NOTE — Progress Notes (Signed)
  Subjective: Feeling some pain in lower abdomen, but still overall comfortable.  Objective: BP 125/76  Pulse 72  Temp(Src) 97.4 F (36.3 C) (Oral)  Resp 16  Ht 5\' 6"  (1.676 m)  Wt 230 lb (104.327 kg)  BMI 37.14 kg/m2  SpO2 100%      FHT: Category 1 UC:   irregular, every 2-4 minutes SVE:   Dilation: 9 Effacement (%): 80 Station: 0 Exam by:: v. Shloimy Michalski, cnm Cervix very soft and strechy, but minimal pressure against cervix during UC.  Assessment:  Transitional labor Irregular contractions  Plan: Augment with pitocin.  Donnel Saxon CNM 07/01/2014, 9:45 PM

## 2014-07-01 NOTE — Progress Notes (Signed)
VE 4/80/0

## 2014-07-01 NOTE — Progress Notes (Signed)
  Subjective: Comfortable with epidural.  Objective: BP 115/72  Pulse 64  Temp(Src) 97.4 F (36.3 C) (Oral)  Resp 16  Ht 5\' 6"  (1.676 m)  Wt 230 lb (104.327 kg)  BMI 37.14 kg/m2  SpO2 100%       FHT: Category 1 UC:   regular, every 4-5 minutes SVE:   Dilation: 7 Effacement (%): 80 Station: -1;0 Exam by:: v. Edona Schreffler, cnm   Assessment:  Progressive labor GBS positive  Plan: Continue to observe at present. Recheck cervix in 1-2 hours--if no change, will augment.  Donnel Saxon CNM 07/01/2014, 8:18 PM

## 2014-07-01 NOTE — Anesthesia Preprocedure Evaluation (Signed)
Anesthesia Evaluation  Patient identified by MRN, date of birth, ID band Patient awake    Reviewed: Allergy & Precautions, H&P , NPO status , Patient's Chart, lab work & pertinent test results  Airway Mallampati: II TM Distance: >3 FB Neck ROM: full    Dental no notable dental hx.    Pulmonary neg pulmonary ROS,  breath sounds clear to auscultation  Pulmonary exam normal       Cardiovascular negative cardio ROS      Neuro/Psych negative neurological ROS  negative psych ROS   GI/Hepatic negative GI ROS, Neg liver ROS,   Endo/Other  negative endocrine ROS  Renal/GU negative Renal ROS     Musculoskeletal   Abdominal (+) + obese,   Peds  Hematology negative hematology ROS (+)   Anesthesia Other Findings   Reproductive/Obstetrics (+) Pregnancy                           Anesthesia Physical Anesthesia Plan  ASA: II  Anesthesia Plan: Epidural   Post-op Pain Management:    Induction:   Airway Management Planned:   Additional Equipment:   Intra-op Plan:   Post-operative Plan:   Informed Consent: I have reviewed the patients History and Physical, chart, labs and discussed the procedure including the risks, benefits and alternatives for the proposed anesthesia with the patient or authorized representative who has indicated his/her understanding and acceptance.     Plan Discussed with:   Anesthesia Plan Comments:         Anesthesia Quick Evaluation

## 2014-07-01 NOTE — Anesthesia Procedure Notes (Signed)
Epidural Patient location during procedure: OB Start time: 07/01/2014 3:23 PM End time: 07/01/2014 3:27 PM  Staffing Anesthesiologist: Lyn Hollingshead Performed by: anesthesiologist   Preanesthetic Checklist Completed: patient identified, surgical consent, pre-op evaluation, timeout performed, IV checked, risks and benefits discussed and monitors and equipment checked  Epidural Patient position: sitting Prep: site prepped and draped and DuraPrep Patient monitoring: continuous pulse ox and blood pressure Approach: midline Location: L3-L4 Injection technique: LOR air  Needle:  Needle type: Tuohy  Needle gauge: 17 G Needle length: 9 cm and 9 Needle insertion depth: 6 cm Catheter type: closed end flexible Catheter size: 19 Gauge Catheter at skin depth: 11 cm Test dose: negative and Other  Assessment Sensory level: T9 Events: blood not aspirated, injection not painful, no injection resistance, negative IV test and no paresthesia  Additional Notes Reason for block:procedure for pain

## 2014-07-02 ENCOUNTER — Encounter (HOSPITAL_COMMUNITY): Payer: Self-pay

## 2014-07-02 LAB — CBC
HEMATOCRIT: 31.6 % — AB (ref 36.0–46.0)
Hemoglobin: 10.5 g/dL — ABNORMAL LOW (ref 12.0–15.0)
MCH: 29.2 pg (ref 26.0–34.0)
MCHC: 33.2 g/dL (ref 30.0–36.0)
MCV: 88 fL (ref 78.0–100.0)
PLATELETS: 121 10*3/uL — AB (ref 150–400)
RBC: 3.59 MIL/uL — ABNORMAL LOW (ref 3.87–5.11)
RDW: 15.1 % (ref 11.5–15.5)
WBC: 13 10*3/uL — AB (ref 4.0–10.5)

## 2014-07-02 MED ORDER — PRENATAL MULTIVITAMIN CH
1.0000 | ORAL_TABLET | Freq: Every day | ORAL | Status: DC
  Administered 2014-07-02 – 2014-07-04 (×3): 1 via ORAL
  Filled 2014-07-02 (×3): qty 1

## 2014-07-02 MED ORDER — ZOLPIDEM TARTRATE 5 MG PO TABS: 5.0000 mg | ORAL_TABLET | Freq: Every evening | ORAL | Status: DC | PRN

## 2014-07-02 MED ORDER — OXYCODONE-ACETAMINOPHEN 5-325 MG PO TABS
1.0000 | ORAL_TABLET | ORAL | Status: DC | PRN
  Administered 2014-07-02: 1 via ORAL
  Filled 2014-07-02: qty 1

## 2014-07-02 MED ORDER — SIMETHICONE 80 MG PO CHEW: 80.0000 mg | CHEWABLE_TABLET | ORAL | Status: DC | PRN

## 2014-07-02 MED ORDER — IBUPROFEN 600 MG PO TABS
600.0000 mg | ORAL_TABLET | Freq: Four times a day (QID) | ORAL | Status: DC
  Administered 2014-07-02 – 2014-07-04 (×10): 600 mg via ORAL
  Filled 2014-07-02 (×10): qty 1

## 2014-07-02 MED ORDER — SENNOSIDES-DOCUSATE SODIUM 8.6-50 MG PO TABS
2.0000 | ORAL_TABLET | ORAL | Status: DC
  Administered 2014-07-02: 2 via ORAL
  Filled 2014-07-02 (×2): qty 2

## 2014-07-02 MED ORDER — LANOLIN HYDROUS EX OINT: TOPICAL_OINTMENT | CUTANEOUS | Status: DC | PRN

## 2014-07-02 MED ORDER — DIPHENHYDRAMINE HCL 25 MG PO CAPS: 25.0000 mg | ORAL_CAPSULE | Freq: Four times a day (QID) | ORAL | Status: DC | PRN

## 2014-07-02 MED ORDER — DIBUCAINE 1 % RE OINT: 1.0000 "application " | TOPICAL_OINTMENT | RECTAL | Status: DC | PRN

## 2014-07-02 MED ORDER — TETANUS-DIPHTH-ACELL PERTUSSIS 5-2.5-18.5 LF-MCG/0.5 IM SUSP: 0.5000 mL | Freq: Once | INTRAMUSCULAR | Status: DC

## 2014-07-02 MED ORDER — ONDANSETRON HCL 4 MG/2ML IJ SOLN: 4.0000 mg | INTRAMUSCULAR | Status: DC | PRN

## 2014-07-02 MED ORDER — WITCH HAZEL-GLYCERIN EX PADS: 1.0000 "application " | MEDICATED_PAD | CUTANEOUS | Status: DC | PRN

## 2014-07-02 MED ORDER — FERROUS SULFATE 325 (65 FE) MG PO TABS
325.0000 mg | ORAL_TABLET | Freq: Two times a day (BID) | ORAL | Status: DC
  Administered 2014-07-02 – 2014-07-04 (×4): 325 mg via ORAL
  Filled 2014-07-02 (×4): qty 1

## 2014-07-02 MED ORDER — BENZOCAINE-MENTHOL 20-0.5 % EX AERO
1.0000 "application " | INHALATION_SPRAY | CUTANEOUS | Status: DC | PRN
  Administered 2014-07-02: 1 via TOPICAL
  Filled 2014-07-02: qty 56

## 2014-07-02 MED ORDER — ONDANSETRON HCL 4 MG PO TABS: 4.0000 mg | ORAL_TABLET | ORAL | Status: DC | PRN

## 2014-07-02 NOTE — Anesthesia Postprocedure Evaluation (Signed)
  Anesthesia Post-op Note  Patient: [name]  Procedure(s) Performed: * No procedures listed *  Patient Location: Mother/Baby  Anesthesia Type:Epidural  Level of Consciousness: awake, alert , oriented and patient cooperative  Airway and Oxygen Therapy: Patient Spontanous Breathing  Post-op Pain: mild  Post-op Assessment: Post-op Vital signs reviewed, Patient's Cardiovascular Status Stable, Respiratory Function Stable, Patent Airway, No signs of Nausea or vomiting, Adequate PO intake, Pain level controlled, No headache, No backache, No residual numbness and No residual motor weakness  Post-op Vital Signs: Reviewed and stable  Last Vitals:  Filed Vitals:   07/02/14 0800  BP: 103/64  Pulse: 76  Temp: 36.7 C  Resp: 18    Complications: No apparent anesthesia complications

## 2014-07-02 NOTE — Lactation Note (Signed)
This note was copied from the chart of [place]. Lactation Consultation Note Mom called for assist. Baby awake with hiccups. Attempted to latch baby- he would take a few sucks then come off the breast. Mom reports it hurts when he is sucking. Attempted on both breasts. Baby off to sleep. Encouraged to page for assist prn  Patient Name: [name] [id number] Date: 07/02/2014 Reason for consult: Follow-up assessment   Maternal Data Formula Feeding for Exclusion: No Has patient been taught Hand Expression?: Yes Does the patient have breastfeeding experience prior to this delivery?: Yes  Feeding Feeding Type: Breast Fed Length of feed: 5 min  LATCH Score/Interventions Latch: Repeated attempts needed to sustain latch, nipple held in mouth throughout feeding, stimulation needed to elicit sucking reflex.  Audible Swallowing: None  Type of Nipple: Everted at rest and after stimulation  Comfort (Breast/Nipple): Filling, red/small blisters or bruises, mild/mod discomfort  Problem noted: Mild/Moderate discomfort  Hold (Positioning): Assistance needed to correctly position infant at breast and maintain latch. Intervention(s): Breastfeeding basics reviewed;Support Pillows;Position options;Skin to skin  LATCH Score: 5  Lactation Tools Discussed/Used     Consult Status Consult Status: Follow-up Date: 07/03/14 Follow-up type: In-patient    Truddie Crumble 07/02/2014, 3:03 PM

## 2014-07-02 NOTE — Lactation Note (Signed)
This note was copied from the chart of [place]. Lactation Consultation Note; Initial visit with this experienced BF mom. She reports that baby nursed well at 3 am but has been sleepy since. Baby asleep in bassinet with 2 blankets on. Unwrapped and undressed. Baby roused slightly but then off to sleep. Attempted to latch but he is too sleepy. Reviewed basic teaching- wide open mouth and keeping the baby close to the breast. BF brochure given with resources for support after DC. Reviewed normal newborn behavior the first 24 hours. No questions at present. Baby skin to skin with mom when I left room. Reviewed feeding cues and encouraged to feed whenever she sees them. To call for assist prn  Patient Name: [name] [id number] Date: 07/02/2014 Reason for consult: Initial assessment   Maternal Data Formula Feeding for Exclusion: No Has patient been taught Hand Expression?: Yes Does the patient have breastfeeding experience prior to this delivery?: Yes  Feeding Feeding Type: Breast Fed  LATCH Score/Interventions Latch: Too sleepy or reluctant, no latch achieved, no sucking elicited.  Audible Swallowing: None  Type of Nipple: Everted at rest and after stimulation  Comfort (Breast/Nipple): Soft / non-tender     Hold (Positioning): Assistance needed to correctly position infant at breast and maintain latch. Intervention(s): Breastfeeding basics reviewed;Support Pillows;Skin to skin  LATCH Score: 5  Lactation Tools Discussed/Used     Consult Status Consult Status: Follow-up Date: 07/03/14 Follow-up type: In-patient    Truddie Crumble 07/02/2014, 1:57 PM

## 2014-07-03 NOTE — Lactation Note (Signed)
This note was copied from the chart of [place]. Lactation Consultation Note     Follow up consult with this mo and baby, now 34 hours post partum, and full term. The baby is beginning to cluster feed, and mom thought she needed formula, because e the baby "was not getting enough milk". I explained to mom what cluster feeding is, how her baby's stomach sis small, . I observed a latch,  Mom needed to sit back, get comfortable, and use pillows to support baby. Mom latched baby in cross cradle hold. The baby latched well, with strong suckles. Mom does have semi flat/short shafted nipples, but easily compressible. Mom  Knows to call for questions/concerns.   Patient Name: [name] [id number] Date: 07/03/2014 Reason for consult: Follow-up assessment   Maternal Data    Feeding Feeding Type: Breast Fed  LATCH Score/Interventions Latch: Grasps breast easily, tongue down, lips flanged, rhythmical sucking. Intervention(s): Teach feeding cues;Waking techniques Intervention(s): Adjust position;Assist with latch;Breast compression  Audible Swallowing: A few with stimulation  Type of Nipple: Everted at rest and after stimulation (short shafted nipple )  Comfort (Breast/Nipple): Soft / non-tender     Hold (Positioning): Assistance needed to correctly position infant at breast and maintain latch. Intervention(s): Support Pillows;Position options;Skin to skin;Breastfeeding basics reviewed  LATCH Score: 8  Lactation Tools Discussed/Used     Consult Status Consult Status: Follow-up Date: 07/04/14 Follow-up type: In-patient    Tonna Corner 07/03/2014, 10:50 AM

## 2014-07-03 NOTE — Lactation Note (Signed)
This note was copied from the chart of [place]. Lactation Consultation Note Mom c/o sore nipples. Had baby latched in football position, baby was kinda sitting on his legs. Pillows has scooted out from under baby it appeared. Re-adjusted pillows and baby for good alignment. Elevated Lg. Pendulum breast on wash cloth so they don't pull on the baby during feeding. Demonstrated chin tug for wide deep latch. Mom stated felt better. Comfort gels given for sore nipples. I don't see any bruising or cracks, but mom states very tender.  Patient Name: [name] [id number] Date: 07/03/2014 Reason for consult: Breast/nipple pain;Follow-up assessment   Maternal Data    Feeding Feeding Type: Breast Fed  LATCH Score/Interventions Latch: Grasps breast easily, tongue down, lips flanged, rhythmical sucking. Intervention(s): Teach feeding cues Intervention(s): Adjust position;Assist with latch;Breast massage;Breast compression  Audible Swallowing: A few with stimulation Intervention(s): Hand expression Intervention(s): Skin to skin;Hand expression;Alternate breast massage  Type of Nipple: Everted at rest and after stimulation  Comfort (Breast/Nipple): Filling, red/small blisters or bruises, mild/mod discomfort  Problem noted: Mild/Moderate discomfort Interventions  (Cracked/bleeding/bruising/blister): Expressed breast milk to nipple Interventions (Mild/moderate discomfort): Comfort gels;Hand expression  Hold (Positioning): Assistance needed to correctly position infant at breast and maintain latch. Intervention(s): Breastfeeding basics reviewed;Support Pillows;Position options;Skin to skin  LATCH Score: 7  Lactation Tools Discussed/Used Tools: Comfort gels   Consult Status Consult Status: Follow-up Date: 07/03/14 Follow-up type: In-patient    [name], Elta [place] 07/03/2014, 12:28 AM

## 2014-07-03 NOTE — Progress Notes (Addendum)
[name]   Subjective: Post Partum Day 1 Vaginal delivery, 2 degree laceration Patient up ad lib, denies syncope or dizziness. Reports consuming regular diet without issues and denies N/V No issues with urination and reports bleeding is appropriate  Feeding:  Breast/bottle Contraceptive plan:   unsure  Objective: Temp:  [98.1 F (36.7 C)-98.2 F (36.8 C)] 98.1 F (36.7 C) (08/06 0615) Pulse Rate:  [64-68] 64 (08/06 0615) Resp:  [18] 18 (08/06 0615) BP: (129-130)/(67-81) 129/81 mmHg (08/06 0615)  Physical Exam:  General: alert and cooperative Ext: WNL, no edema. No evidence of DVT seen on physical exam. Breast: Soft filling Lungs: CTAB Heart RRR without murmur  Abdomen:  Soft, fundus firm, lochia scant, + bowel sounds, non distended, non tender Lochia: appropriate Uterine Fundus: firm Laceration: healing well    Recent Labs  07/01/14 1135 07/02/14 0542  HGB 11.7* 10.5*  HCT 34.8* 31.6*    Assessment S/P Vaginal Delivery-Day 1 Stable  Normal [employer] Breastfeeding/Bottlefeeding Circumcision: inpatient  Plan: Continue current care Dr.Kulwa updated on patient status  Plan for discharge tomorrow, Circumcision prior to discharge and Contraception unsure Lactation support   Abdulkadir Emmanuel, CNM, MSN 07/03/2014, 8:46 AM

## 2014-07-04 MED ORDER — IBUPROFEN 600 MG PO TABS: 600.0000 mg | ORAL_TABLET | Freq: Four times a day (QID) | ORAL | Status: DC | PRN

## 2014-07-04 MED ORDER — OXYCODONE-ACETAMINOPHEN 5-325 MG PO TABS: 1.0000 | ORAL_TABLET | ORAL | Status: DC | PRN

## 2014-07-04 NOTE — Lactation Note (Signed)
This note was copied from the chart of [place]. Lactation Consultation Note  Patient Name: [name] Song [id number] Date: 07/04/2014 Reason for consult: Follow-up assessment;Breast/nipple pain Mom c/o of very sore nipples. She does not want to latch baby to right breast. She has started to supplement due to nipple pain and baby wanting to cluster feed. Bilateral nipples have positional stripes but are flat with breast compression. Mom was agreeable to trying nipple shield on left breast to see if this will make BF more tolerable. Initiated #20 nipple shield, still some discomfort, changed to #24 nipple shield. Baby nursed well on both however transferred milk better with the #20. Mom tolerated the feeding on the left breast but still continued to report some discomfort. Mom is considering pumping to rest nipples. She is to call her insurance company about [place]. Discussed pump rental program. Mom to advise if she needs rental. Advised Mom if she pump/bottle, that she needs to pump every 3 hours for 15 minutes. Once nipples heal she may consider retrying the nipple shield to see if this helps with latch. Mom will continue to supplement till baby latching consistently and satisfied at the breast. Scheduled OP f/u for Friday, 07/11/14 at 4:00 pm.   Maternal Data    Feeding Feeding Type: Breast Fed Length of feed: 15 min  LATCH Score/Interventions Latch: Grasps breast easily, tongue down, lips flanged, rhythmical sucking. (initiated #20 nipple shield due to nipple pain) Intervention(s): Adjust position;Assist with latch;Breast massage;Breast compression  Audible Swallowing: Spontaneous and intermittent  Type of Nipple: Flat  Comfort (Breast/Nipple): Engorged, cracked, bleeding, large blisters, severe discomfort  Problem noted: Cracked, bleeding, blisters, bruises;Severe discomfort Interventions  (Cracked/bleeding/bruising/blister): Expressed breast milk to nipple Interventions  (Mild/moderate discomfort): Comfort gels  Hold (Positioning): Assistance needed to correctly position infant at breast and maintain latch. Intervention(s): Breastfeeding basics reviewed;Support Pillows;Position options;Skin to skin  LATCH Score: 6  Lactation Tools Discussed/Used Tools: Nipple Shields;Comfort gels;Pump Nipple shield size: 20;24 Breast pump type: Manual   Consult Status Consult Status: Follow-up Date: 07/04/14 Follow-up type: In-patient    Katrine Coho 07/04/2014, 11:58 AM

## 2014-07-04 NOTE — Discharge Instructions (Signed)

## 2014-07-04 NOTE — Discharge Summary (Signed)
  Vaginal Delivery Discharge Summary  [name]  DOB:    [birth year] MRN:    [id number] CSN:    [id number]  Date of admission:                  07/01/14  Date of discharge:                   07/02/14  Procedures this admission:   NSVB, Repair of 2nd degree perineal  Date of Delivery: 07/02/14  Newborn Data:  Live born female  Birth Weight: 8 lb 4 oz (3742 g) APGAR: 9, 9  Home with mother. Name: [name] Circumcision Plan: Inpatient  History of Present Illness:  [name] is a 38 y.o. female, G2P1002, who presents at [redacted]w[redacted]d weeks gestation. The patient has been followed at the [facility] Gynecology division of [facility]. She was admitted onset of labor and rupture of membranes. Her pregnancy has been complicated by:  Patient Active Problem List   Diagnosis Date Noted  . Vaginal delivery 07/02/2014  . GBS (group B Streptococcus carrier), +RV culture, currently pregnant 07/01/2014  . Herpes zoster 07/01/2014  . Small bowel obstruction 07/01/2014  . Cyst of ovary, right 07/01/2014  . Elderly multigravida 07/01/2014  . Female pelvic pain 01/23/2013  . Lactose intolerance 01/23/2013  . Infertility, female 01/23/2013  . 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 Positive GBS. Progressed well without augmentation. Utilized epidural for pain management.  Delivery was performed by Donnel Saxon, CNM, without complication. Patient and baby tolerated the procedure without difficulty, with  2nd degree perineal laceration noted. Infant status was stable and remained in room with mother.  Mother and infant then had an uncomplicated postpartum course, with breast and bottle feeding going well. Mom's physical exam was WNL, and she was discharged home in stable condition. Contraception plan was undecided at the time of d/c.  She received adequate benefit from po pain medications, and was d/c'd home  with Motrin and Percocet.   Feeding:  breast and bottle  Contraception:  Undecided at present  Discharge hemoglobin:  Hemoglobin  Date Value Ref Range Status  07/02/2014 10.5* 12.0 - 15.0 g/dL Final     HCT  Date Value Ref Range Status  07/02/2014 31.6* 36.0 - 46.0 % Final    Discharge Physical Exam:   General: alert Lochia: appropriate Uterine Fundus: firm Incision: healing well DVT Evaluation: No evidence of DVT seen on physical exam. Negative Homan's sign.  Intrapartum Procedures: spontaneous vaginal delivery Postpartum Procedures: none Complications-Operative and Postpartum: 2nd degree perineal laceration  Discharge Diagnoses: Term Pregnancy-delivered, GBS positive  Discharge Information:  Activity:           pelvic rest Diet:                routine Medications: Ibuprofen and Percocet Condition:      stable Instructions:     Discharge to: home  Follow-up Information   Follow up with [facility] In 5 weeks. (Call for any questions or concerns.)    Specialty:  Obstetrics and Gynecology   Contact information:   [address] [address] [phone]       Donnel Saxon [facility] 07/04/2014 7:53 AM

## 2014-07-09 ENCOUNTER — Inpatient Hospital Stay (HOSPITAL_COMMUNITY)
Admission: AD | Admit: 2014-07-09 | Discharge: 2014-07-13 | DRG: 776 | Disposition: A | Discharge status: Home / Self Care | Payer: 59 | Source: Ambulatory Visit | Attending: Obstetrics & Gynecology | Admitting: Obstetrics & Gynecology

## 2014-07-09 ENCOUNTER — Encounter (HOSPITAL_COMMUNITY): Payer: Self-pay | Admitting: Obstetrics and Gynecology

## 2014-07-09 DIAGNOSIS — K649 Unspecified hemorrhoids: Secondary | ICD-10-CM | POA: Diagnosis present

## 2014-07-09 DIAGNOSIS — O1405 Mild to moderate pre-eclampsia, complicating the puerperium: Secondary | ICD-10-CM | POA: Diagnosis present

## 2014-07-09 DIAGNOSIS — Z8249 Family history of ischemic heart disease and other diseases of the circulatory system: Secondary | ICD-10-CM

## 2014-07-09 DIAGNOSIS — M79609 Pain in unspecified limb: Secondary | ICD-10-CM

## 2014-07-09 DIAGNOSIS — O878 Other venous complications in the puerperium: Secondary | ICD-10-CM | POA: Diagnosis present

## 2014-07-09 DIAGNOSIS — Z833 Family history of diabetes mellitus: Secondary | ICD-10-CM

## 2014-07-09 DIAGNOSIS — O165 Unspecified maternal hypertension, complicating the puerperium: Secondary | ICD-10-CM | POA: Diagnosis present

## 2014-07-09 DIAGNOSIS — R030 Elevated blood-pressure reading, without diagnosis of hypertension: Secondary | ICD-10-CM | POA: Diagnosis present

## 2014-07-09 DIAGNOSIS — IMO0002 Reserved for concepts with insufficient information to code with codable children: Secondary | ICD-10-CM | POA: Diagnosis not present

## 2014-07-09 LAB — COMPREHENSIVE METABOLIC PANEL
ALBUMIN: 3 g/dL — AB (ref 3.5–5.2)
ALK PHOS: 116 U/L (ref 39–117)
ALT: 47 U/L — ABNORMAL HIGH (ref 0–35)
ANION GAP: 10 (ref 5–15)
AST: 30 U/L (ref 0–37)
BUN: 12 mg/dL (ref 6–23)
CO2: 26 mEq/L (ref 19–32)
Calcium: 9.6 mg/dL (ref 8.4–10.5)
Chloride: 104 mEq/L (ref 96–112)
Creatinine, Ser: 0.77 mg/dL (ref 0.50–1.10)
GFR calc Af Amer: >90 mL/min (ref >90)
GFR calc non Af Amer: >90 mL/min (ref >90)
Glucose, Bld: 80 mg/dL (ref 70–99)
POTASSIUM: 4.2 meq/L (ref 3.7–5.3)
Sodium: 140 mEq/L (ref 137–147)
Total Bilirubin: 0.3 mg/dL (ref 0.3–1.2)
Total Protein: 6.3 g/dL (ref 6.0–8.3)

## 2014-07-09 LAB — CBC
HCT: 35.1 % — ABNORMAL LOW (ref 36.0–46.0)
HEMOGLOBIN: 11.7 g/dL — AB (ref 12.0–15.0)
MCH: 29.5 pg (ref 26.0–34.0)
MCHC: 33.3 g/dL (ref 30.0–36.0)
MCV: 88.4 fL (ref 78.0–100.0)
PLATELETS: 183 10*3/uL (ref 150–400)
RBC: 3.97 MIL/uL (ref 3.87–5.11)
RDW: 14.6 % (ref 11.5–15.5)
WBC: 6.1 10*3/uL (ref 4.0–10.5)

## 2014-07-09 LAB — URIC ACID: URIC ACID, SERUM: 6 mg/dL (ref 2.4–7.0)

## 2014-07-09 LAB — PROTEIN / CREATININE RATIO, URINE
CREATININE, URINE: 93.62 mg/dL
Protein Creatinine Ratio: 0.1 (ref 0.00–0.15)
TOTAL PROTEIN, URINE: 9.1 mg/dL

## 2014-07-09 LAB — LACTATE DEHYDROGENASE: LDH: 291 U/L — ABNORMAL HIGH (ref 94–250)

## 2014-07-09 LAB — MAGNESIUM: Magnesium: 4.2 mg/dL — ABNORMAL HIGH (ref 1.5–2.5)

## 2014-07-09 LAB — MRSA PCR SCREENING: MRSA by PCR: INVALID — AB

## 2014-07-09 MED ORDER — ONDANSETRON HCL 4 MG PO TABS: 4.0000 mg | ORAL_TABLET | Freq: Four times a day (QID) | ORAL | Status: DC | PRN

## 2014-07-09 MED ORDER — PRENATAL MULTIVITAMIN CH
1.0000 | ORAL_TABLET | Freq: Every day | ORAL | Status: DC
  Administered 2014-07-10 – 2014-07-13 (×4): 1 via ORAL
  Filled 2014-07-09 (×5): qty 1

## 2014-07-09 MED ORDER — ONDANSETRON HCL 4 MG/2ML IJ SOLN: 4.0000 mg | Freq: Four times a day (QID) | INTRAMUSCULAR | Status: DC | PRN

## 2014-07-09 MED ORDER — ZOLPIDEM TARTRATE 5 MG PO TABS
5.0000 mg | ORAL_TABLET | Freq: Every evening | ORAL | Status: DC | PRN
  Administered 2014-07-11 – 2014-07-13 (×2): 5 mg via ORAL
  Filled 2014-07-09 (×2): qty 1

## 2014-07-09 MED ORDER — IBUPROFEN 600 MG PO TABS
600.0000 mg | ORAL_TABLET | Freq: Four times a day (QID) | ORAL | Status: DC | PRN
  Administered 2014-07-09 – 2014-07-13 (×5): 600 mg via ORAL
  Filled 2014-07-09 (×5): qty 1

## 2014-07-09 MED ORDER — OXYCODONE-ACETAMINOPHEN 5-325 MG PO TABS
1.0000 | ORAL_TABLET | ORAL | Status: DC | PRN
  Administered 2014-07-09: 1 via ORAL
  Administered 2014-07-12 – 2014-07-13 (×3): 2 via ORAL
  Filled 2014-07-09: qty 1
  Filled 2014-07-09 (×3): qty 2

## 2014-07-09 MED ORDER — LACTATED RINGERS IV SOLN
INTRAVENOUS | Status: DC
  Administered 2014-07-09 – 2014-07-10 (×4): via INTRAVENOUS

## 2014-07-09 MED ORDER — LABETALOL HCL 5 MG/ML IV SOLN
20.0000 mg | INTRAVENOUS | Status: DC | PRN
  Administered 2014-07-09 – 2014-07-11 (×4): 20 mg via INTRAVENOUS
  Administered 2014-07-11: 40 mg via INTRAVENOUS
  Administered 2014-07-11 – 2014-07-12 (×3): 20 mg via INTRAVENOUS
  Administered 2014-07-12: 40 mg via INTRAVENOUS
  Filled 2014-07-09 (×12): qty 4

## 2014-07-09 MED ORDER — MAGNESIUM SULFATE BOLUS VIA INFUSION
4.0000 g | Freq: Once | INTRAVENOUS | Status: AC
  Administered 2014-07-09: 4 g via INTRAVENOUS
  Filled 2014-07-09: qty 500

## 2014-07-09 MED ORDER — IBUPROFEN 600 MG PO TABS
600.0000 mg | ORAL_TABLET | ORAL | Status: AC
  Administered 2014-07-09: 600 mg via ORAL
  Filled 2014-07-09: qty 1

## 2014-07-09 MED ORDER — MAGNESIUM SULFATE 40 G IN LACTATED RINGERS - SIMPLE
2.0000 g/h | INTRAVENOUS | Status: AC
  Administered 2014-07-09 – 2014-07-10 (×2): 2 g/h via INTRAVENOUS
  Filled 2014-07-09 (×2): qty 500

## 2014-07-09 NOTE — H&P (Signed)
38 yo 2P2002 s/p SVB on 8/5 presented after calling office with increased swelling and elevated BP on home monitor of 170/110.  No hx of hypertension during pregnancy, labor, or pp.  Has had moderate HA today and occasional RUQ pain, but no visual sx.  Had pre-eclampsia after 2006 delivery--presented 2 weeks after vaginal delivery with elevated BP.  Received magnesium sulfate for 24 hours and was d/c'd home on Labetalol 200 mg po BID.  Also c/o pain in right leg from hip down--skin numb/sensitive in thigh area, feels "aching" in thigh and down leg.  Patient Active Problem List   Diagnosis Date Noted  . Postpartum hypertension--hx after last delivery 07/09/2014  . Vaginal delivery 07/02/2014  . GBS (group B Streptococcus carrier), +RV culture, currently pregnant 07/01/2014  . Herpes zoster 07/01/2014  . Small bowel obstruction 07/01/2014  . Cyst of ovary, right 07/01/2014  . Elderly multigravida 07/01/2014  . Female pelvic pain 01/23/2013  . Lactose intolerance 01/23/2013  . Infertility, female 01/23/2013  . 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 Hypertension  . Headache  . Foot Swelling   HPI:  See above  OB History   Grav Para Term Preterm Abortions TAB SAB Ect Mult Living   2 2 2       2     2006--SVB, full term, uncomplicated delivery, admitted 2 weeks pp for pp pre-eclampsia. Received magnesium, home on Labetalol 07/02/14--SVB, full term, uncomplicated.  Past Medical History  Diagnosis Date  . Dermoid cyst     h/o  . Bowel obstruction 4/13  . Hx of ovarian cyst   . HSV (herpes simplex virus) infection     Past Surgical History  Procedure Laterality Date  . Oophorectomy      left?  . Ovarian cyst surgery      LAP CYSTECTOMY    Family History  Problem Relation Age of Onset  . Ovarian cysts Maternal Grandmother   . Hypertension Maternal Grandmother   . Diabetes Maternal Grandmother   . Hypertension Maternal Grandfather   .  Hypertension Father   . Hypertension Mother   . Ovarian cysts Sister     History  Substance Use Topics  . Smoking status: Never Smoker   . Smokeless tobacco: Never Used  . Alcohol Use: No    Allergies: No Known Allergies  Prescriptions prior to admission  Medication Sig Dispense Refill  . cholecalciferol (VITAMIN D) 1000 UNITS tablet Take 1,000 Units by mouth daily.      Marland Kitchen ibuprofen (ADVIL,MOTRIN) 600 MG tablet Take 1 tablet (600 mg total) by mouth every 6 (six) hours as needed.  30 tablet  2  . Magnesium Hydroxide (MAGNESIA PO) Take 1 tablet by mouth daily.      Marland Kitchen oxyCODONE-acetaminophen (PERCOCET/ROXICET) 5-325 MG per tablet Take 1-2 tablets by mouth every 4 (four) hours as needed for severe pain (moderate - severe pain).  30 tablet  0  . Prenatal Vit-Fe Fumarate-FA (PRENATAL MULTIVITAMIN) TABS tablet Take 1 tablet by mouth daily at 12 noon.      . valACYclovir (VALTREX) 500 MG tablet Take 500 mg by mouth daily as needed (outbreaks).        ROS:  Mild occipital HA, swelling in feet/legs, aching in right leg/thigh Physical Exam   Blood pressure 177/83, pulse 52, temperature 99.1 F (37.3 C), temperature source Oral, resp. rate 18, unknown if currently breastfeeding.  Filed Vitals:   07/09/14 1346 07/09/14 1405 07/09/14 1452  BP: 178/83 169/87 177/83  Pulse: 48 50 52  Temp: 99.1 F (37.3 C)    TempSrc: Oral    Resp: 18       Physical Exam In NAD Chest clear Heart RRR without murmur Abd soft, NT, no rebound or guarding, no RUQ tenderness Fundus firm, approx 12 week size, NT Pelvic--deferred, minimal bleeding Ext DTR 3+, 1-2 beats clonus bilaterally, 1+ edema in LE Negative Homan's bilaterally. Right leg with sensitivity to skin of thigh.   ED Course  Assessment: 7 days s/p SVB Elevated BP  Plan: PIH labs PCR by cath UA Serial BPs Ibuprophen    Donnel Saxon CNM, MSN 07/09/2014 3:01 PM  Addendum:  Doppler study negative for DVT.  Results for orders  placed during the hospital encounter of 07/09/14 (from the past 24 hour(s))  CBC     Status: Abnormal   Collection Time    07/09/14  1:45 PM      Result Value Ref Range   WBC 6.1  4.0 - 10.5 K/uL   RBC 3.97  3.87 - 5.11 MIL/uL   Hemoglobin 11.7 (*) 12.0 - 15.0 g/dL   HCT 35.1 (*) 36.0 - 46.0 %   MCV 88.4  78.0 - 100.0 fL   MCH 29.5  26.0 - 34.0 pg   MCHC 33.3  30.0 - 36.0 g/dL   RDW 14.6  11.5 - 15.5 %   Platelets 183  150 - 400 K/uL  COMPREHENSIVE METABOLIC PANEL     Status: Abnormal   Collection Time    07/09/14  1:45 PM      Result Value Ref Range   Sodium 140  137 - 147 mEq/L   Potassium 4.2  3.7 - 5.3 mEq/L   Chloride 104  96 - 112 mEq/L   CO2 26  19 - 32 mEq/L   Glucose, Bld 80  70 - 99 mg/dL   BUN 12  6 - 23 mg/dL   Creatinine, Ser 0.77  0.50 - 1.10 mg/dL   Calcium 9.6  8.4 - 10.5 mg/dL   Total Protein 6.3  6.0 - 8.3 g/dL   Albumin 3.0 (*) 3.5 - 5.2 g/dL   AST 30  0 - 37 U/L   ALT 47 (*) 0 - 35 U/L   Alkaline Phosphatase 116  39 - 117 U/L   Total Bilirubin 0.3  0.3 - 1.2 mg/dL   GFR calc non Af Amer >90  >90 mL/min   GFR calc Af Amer >90  >90 mL/min   Anion gap 10  5 - 15  LACTATE DEHYDROGENASE     Status: Abnormal   Collection Time    07/09/14  1:45 PM      Result Value Ref Range   LDH 291 (*) 94 - 250 U/L  URIC ACID     Status: None   Collection Time    07/09/14  1:45 PM      Result Value Ref Range   Uric Acid, Serum 6.0  2.4 - 7.0 mg/dL  PROTEIN / CREATININE RATIO, URINE     Status: None   Collection Time    07/09/14  2:00 PM      Result Value Ref Range   Creatinine, Urine 93.62     Total Protein, Urine 9.1     PROTEIN CREATININE RATIO 0.10  0.00 - 0.15   Impression/Plan: Mild pre-eclampsia Breastfeeding No evidence DVT Will consult with Dr. Alesia Richards for plan of care.  Donnel Saxon, CNM 07/09/14 2:10p  Addendum: Reviewed labs, BPs, and symptoms with Dr. Alesia Richards. Will admit to AICU for magnesium sulfate x 24 hours due to pre-eclampsia Mag level in 6  hours, repeat PIH labs in am. Patient may continue breastfeeding--patient advises she will pump while here, with baby remaining home with her parents.  Donnel Saxon, CNM 07/09/14 4/20p

## 2014-07-09 NOTE — MAU Provider Note (Signed)
History   38 yo 2P2002 s/p SVB on 8/5 presented after calling office with increased swelling and elevated BP on home monitor of 170/110.  No hx of hypertension during pregnancy, labor, or pp.  Has had moderate HA today and occasional RUQ pain, but no visual sx.  Had pre-eclampsia after 2006 delivery--presented 2 weeks after vaginal delivery with elevated BP.  Received magnesium sulfate for 24 hours and was d/c'd home on Labetalol 200 mg po BID.  Also c/o pain in right leg from hip down--skin numb/sensitive in thigh area, feels "aching" in thigh and down leg.  Patient Active Problem List   Diagnosis Date Noted  . Postpartum hypertension--hx after last delivery 07/09/2014  . Vaginal delivery 07/02/2014  . GBS (group B Streptococcus carrier), +RV culture, currently pregnant 07/01/2014  . Herpes zoster 07/01/2014  . Small bowel obstruction 07/01/2014  . Cyst of ovary, right 07/01/2014  . Elderly multigravida 07/01/2014  . Female pelvic pain 01/23/2013  . Lactose intolerance 01/23/2013  . Infertility, female 01/23/2013  . 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 Hypertension  . Headache  . Foot Swelling   HPI:  See above  OB History   Grav Para Term Preterm Abortions TAB SAB Ect Mult Living   2 2 2       2     2006--SVB, full term, uncomplicated delivery, admitted 2 weeks pp for pp pre-eclampsia. Received magnesium, home on Labetalol 07/02/14--SVB, full term, uncomplicated.  Past Medical History  Diagnosis Date  . Dermoid cyst     h/o  . Bowel obstruction 4/13  . Hx of ovarian cyst   . HSV (herpes simplex virus) infection     Past Surgical History  Procedure Laterality Date  . Oophorectomy      left?  . Ovarian cyst surgery      LAP CYSTECTOMY    Family History  Problem Relation Age of Onset  . Ovarian cysts Maternal Grandmother   . Hypertension Maternal Grandmother   . Diabetes Maternal Grandmother   . Hypertension Maternal  Grandfather   . Hypertension Father   . Hypertension Mother   . Ovarian cysts Sister     History  Substance Use Topics  . Smoking status: Never Smoker   . Smokeless tobacco: Never Used  . Alcohol Use: No    Allergies: No Known Allergies  Prescriptions prior to admission  Medication Sig Dispense Refill  . cholecalciferol (VITAMIN D) 1000 UNITS tablet Take 1,000 Units by mouth daily.      Marland Kitchen ibuprofen (ADVIL,MOTRIN) 600 MG tablet Take 1 tablet (600 mg total) by mouth every 6 (six) hours as needed.  30 tablet  2  . Magnesium Hydroxide (MAGNESIA PO) Take 1 tablet by mouth daily.      Marland Kitchen oxyCODONE-acetaminophen (PERCOCET/ROXICET) 5-325 MG per tablet Take 1-2 tablets by mouth every 4 (four) hours as needed for severe pain (moderate - severe pain).  30 tablet  0  . Prenatal Vit-Fe Fumarate-FA (PRENATAL MULTIVITAMIN) TABS tablet Take 1 tablet by mouth daily at 12 noon.      . valACYclovir (VALTREX) 500 MG tablet Take 500 mg by mouth daily as needed (outbreaks).        ROS:  Mild occipital HA, swelling in feet/legs, aching in right leg/thigh Physical Exam   Blood pressure 177/83, pulse 52, temperature 99.1 F (37.3 C), temperature source Oral, resp. rate 18, unknown if currently breastfeeding.  Filed Vitals:   07/09/14 1346 07/09/14 1405  07/09/14 1452  BP: 178/83 169/87 177/83  Pulse: 48 50 52  Temp: 99.1 F (37.3 C)    TempSrc: Oral    Resp: 18       Physical Exam In NAD Chest clear Heart RRR without murmur Abd soft, NT, no rebound or guarding, no RUQ tenderness Fundus firm, approx 12 week size, NT Pelvic--deferred, minimal bleeding Ext DTR 3+, 1-2 beats clonus bilaterally, 1+ edema in LE Negative Homan's bilaterally. Right leg with sensitivity to skin of thigh.   ED Course  Assessment: 7 days s/p SVB Elevated BP  Plan: PIH labs PCR by cath UA Serial BPs Ibuprophen    Donnel Saxon CNM, MSN 07/09/2014 3:01 PM  Addendum:  Doppler study negative for  DVT.  Results for orders placed during the hospital encounter of 07/09/14 (from the past 24 hour(s))  CBC     Status: Abnormal   Collection Time    07/09/14  1:45 PM      Result Value Ref Range   WBC 6.1  4.0 - 10.5 K/uL   RBC 3.97  3.87 - 5.11 MIL/uL   Hemoglobin 11.7 (*) 12.0 - 15.0 g/dL   HCT 35.1 (*) 36.0 - 46.0 %   MCV 88.4  78.0 - 100.0 fL   MCH 29.5  26.0 - 34.0 pg   MCHC 33.3  30.0 - 36.0 g/dL   RDW 14.6  11.5 - 15.5 %   Platelets 183  150 - 400 K/uL  COMPREHENSIVE METABOLIC PANEL     Status: Abnormal   Collection Time    07/09/14  1:45 PM      Result Value Ref Range   Sodium 140  137 - 147 mEq/L   Potassium 4.2  3.7 - 5.3 mEq/L   Chloride 104  96 - 112 mEq/L   CO2 26  19 - 32 mEq/L   Glucose, Bld 80  70 - 99 mg/dL   BUN 12  6 - 23 mg/dL   Creatinine, Ser 0.77  0.50 - 1.10 mg/dL   Calcium 9.6  8.4 - 10.5 mg/dL   Total Protein 6.3  6.0 - 8.3 g/dL   Albumin 3.0 (*) 3.5 - 5.2 g/dL   AST 30  0 - 37 U/L   ALT 47 (*) 0 - 35 U/L   Alkaline Phosphatase 116  39 - 117 U/L   Total Bilirubin 0.3  0.3 - 1.2 mg/dL   GFR calc non Af Amer >90  >90 mL/min   GFR calc Af Amer >90  >90 mL/min   Anion gap 10  5 - 15  LACTATE DEHYDROGENASE     Status: Abnormal   Collection Time    07/09/14  1:45 PM      Result Value Ref Range   LDH 291 (*) 94 - 250 U/L  URIC ACID     Status: None   Collection Time    07/09/14  1:45 PM      Result Value Ref Range   Uric Acid, Serum 6.0  2.4 - 7.0 mg/dL  PROTEIN / CREATININE RATIO, URINE     Status: None   Collection Time    07/09/14  2:00 PM      Result Value Ref Range   Creatinine, Urine 93.62     Total Protein, Urine 9.1     PROTEIN CREATININE RATIO 0.10  0.00 - 0.15   Impression/Plan: Mild pre-eclampsia Breastfeeding No evidence DVT Will consult with Dr. Alesia Richards for plan of care.  Donnel Saxon,  CNM 07/09/14 2:10p  Addendum: Reviewed labs, BPs, and symptoms with Dr. Alesia Richards. Will admit to AICU for magnesium sulfate x 24 hours due to  pre-eclampsia Mag level in 6 hours, repeat PIH labs in am. Patient may continue breastfeeding--patient advises she will pump while here, with baby remaining home with her parents.  Donnel Saxon, CNM 07/09/14 4/20p

## 2014-07-09 NOTE — Progress Notes (Signed)
*  Preliminary Results* Right lower extremity venous duplex completed. Right lower extremity is negative for deep vein thrombosis. There is no evidence of right Baker's cyst.  07/09/2014 3:32 PM  Maudry Mayhew, RVT, RDCS, RDMS

## 2014-07-09 NOTE — MAU Note (Signed)
Vag delivery 74/94; uncomplicated.  Headache started yesterday. Sent from Dr's office BP elevated, HA and swelling. No visual changes or constant RT upper abd pain.  Baby is doing well, breast and bottle feeding- going well

## 2014-07-10 LAB — COMPREHENSIVE METABOLIC PANEL
ALK PHOS: 105 U/L (ref 39–117)
ALT: 37 U/L — AB (ref 0–35)
AST: 23 U/L (ref 0–37)
Albumin: 2.7 g/dL — ABNORMAL LOW (ref 3.5–5.2)
Anion gap: 11 (ref 5–15)
BUN: 11 mg/dL (ref 6–23)
CO2: 25 meq/L (ref 19–32)
Calcium: 7.7 mg/dL — ABNORMAL LOW (ref 8.4–10.5)
Chloride: 104 mEq/L (ref 96–112)
Creatinine, Ser: 0.73 mg/dL (ref 0.50–1.10)
GFR calc non Af Amer: >90 mL/min (ref >90)
GLUCOSE: 93 mg/dL (ref 70–99)
Potassium: 3.6 mEq/L — ABNORMAL LOW (ref 3.7–5.3)
SODIUM: 140 meq/L (ref 137–147)
TOTAL PROTEIN: 6.2 g/dL (ref 6.0–8.3)
Total Bilirubin: <0.2 mg/dL — ABNORMAL LOW (ref 0.3–1.2)

## 2014-07-10 LAB — CBC
HCT: 34.9 % — ABNORMAL LOW (ref 36.0–46.0)
Hemoglobin: 11.8 g/dL — ABNORMAL LOW (ref 12.0–15.0)
MCH: 29.7 pg (ref 26.0–34.0)
MCHC: 33.8 g/dL (ref 30.0–36.0)
MCV: 87.9 fL (ref 78.0–100.0)
PLATELETS: 184 10*3/uL (ref 150–400)
RBC: 3.97 MIL/uL (ref 3.87–5.11)
RDW: 14.5 % (ref 11.5–15.5)
WBC: 6.4 10*3/uL (ref 4.0–10.5)

## 2014-07-10 LAB — URIC ACID: Uric Acid, Serum: 5.9 mg/dL (ref 2.4–7.0)

## 2014-07-10 LAB — LACTATE DEHYDROGENASE: LDH: 264 U/L — AB (ref 94–250)

## 2014-07-10 MED ORDER — CYCLOBENZAPRINE HCL 10 MG PO TABS
10.0000 mg | ORAL_TABLET | Freq: Three times a day (TID) | ORAL | Status: DC | PRN
  Administered 2014-07-10 – 2014-07-11 (×3): 10 mg via ORAL
  Filled 2014-07-10: qty 1

## 2014-07-10 NOTE — Progress Notes (Signed)
Subjective: Postpartum Day 8: S/P SVB, readmit for pp pre-eclampsia Feeling better, but still has low occipital HA.  Denies visual sx or epigastric pain. Reports right leg not aching today--improved. Took Ibuprophen and Percocet during night--feels Percocet helped minimally for HA, may have helped her rest. Feeding:  Pumping--baby remains at home with family Contraceptive plan:  Undecided  Objective: Vital signs in last 24 hours: Temp:  [97.5 F (36.4 C)-99.1 F (37.3 C)] 98.3 F (36.8 C) (08/13 0700) Pulse Rate:  [48-89] 80 (08/13 0700) Resp:  [16-18] 18 (08/13 0700) BP: (130-189)/(72-94) 141/84 mmHg (08/13 0700) SpO2:  [96 %-100 %] 100 % (08/13 0700) Weight:  [207 lb 8 oz (94.121 kg)] 207 lb 8 oz (94.121 kg) (08/12 1647)  Filed Vitals:   07/10/14 0400 07/10/14 0500 07/10/14 0600 07/10/14 0700  BP: 158/85 147/85 141/84 141/84  Pulse: 71 69 89 80  Temp: 98.6 F (37 C)   98.3 F (36.8 C)  TempSrc: Oral   Oral  Resp: 18   18  Height:      Weight:      SpO2: 98% 99% 100% 100%  BP range since MN--141-158/80-85   Physical Exam:  General: alert Lochia: appropriate Uterine Fundus: firm Perineum: healing well DVT Evaluation: Negative Homan's sign. Calf/Ankle edema is present. DTR 2-3+, 1-2 beats clonus bilaterally (same as yesterday) Tr/1+ edema (slightly improved from yesterday)  Magnesium at 2 gm/hr--infusion began at 1730.  I/O balance:  + 459 overnight, but 2000 cc output overnight   Results for orders placed during the hospital encounter of 07/09/14 (from the past 24 hour(s))  CBC     Status: Abnormal   Collection Time    07/09/14  1:45 PM      Result Value Ref Range   WBC 6.1  4.0 - 10.5 K/uL   RBC 3.97  3.87 - 5.11 MIL/uL   Hemoglobin 11.7 (*) 12.0 - 15.0 g/dL   HCT 35.1 (*) 36.0 - 46.0 %   MCV 88.4  78.0 - 100.0 fL   MCH 29.5  26.0 - 34.0 pg   MCHC 33.3  30.0 - 36.0 g/dL   RDW 14.6  11.5 - 15.5 %   Platelets 183  150 - 400 K/uL  COMPREHENSIVE METABOLIC  PANEL     Status: Abnormal   Collection Time    07/09/14  1:45 PM      Result Value Ref Range   Sodium 140  137 - 147 mEq/L   Potassium 4.2  3.7 - 5.3 mEq/L   Chloride 104  96 - 112 mEq/L   CO2 26  19 - 32 mEq/L   Glucose, Bld 80  70 - 99 mg/dL   BUN 12  6 - 23 mg/dL   Creatinine, Ser 0.77  0.50 - 1.10 mg/dL   Calcium 9.6  8.4 - 10.5 mg/dL   Total Protein 6.3  6.0 - 8.3 g/dL   Albumin 3.0 (*) 3.5 - 5.2 g/dL   AST 30  0 - 37 U/L   ALT 47 (*) 0 - 35 U/L   Alkaline Phosphatase 116  39 - 117 U/L   Total Bilirubin 0.3  0.3 - 1.2 mg/dL   GFR calc non Af Amer >90  >90 mL/min   GFR calc Af Amer >90  >90 mL/min   Anion gap 10  5 - 15  LACTATE DEHYDROGENASE     Status: Abnormal   Collection Time    07/09/14  1:45 PM  Result Value Ref Range   LDH 291 (*) 94 - 250 U/L  URIC ACID     Status: None   Collection Time    07/09/14  1:45 PM      Result Value Ref Range   Uric Acid, Serum 6.0  2.4 - 7.0 mg/dL  PROTEIN / CREATININE RATIO, URINE     Status: None   Collection Time    07/09/14  2:00 PM      Result Value Ref Range   Creatinine, Urine 93.62     Total Protein, Urine 9.1     PROTEIN CREATININE RATIO 0.10  0.00 - 0.15  MRSA PCR SCREENING     Status: Abnormal   Collection Time    07/09/14  5:50 PM      Result Value Ref Range   MRSA by PCR INVALID RESULTS, SPECIMEN SENT FOR CULTURE (*) NEGATIVE  MAGNESIUM     Status: Abnormal   Collection Time    07/09/14 11:26 PM      Result Value Ref Range   Magnesium 4.2 (*) 1.5 - 2.5 mg/dL  CBC     Status: Abnormal   Collection Time    07/10/14  5:38 AM      Result Value Ref Range   WBC 6.4  4.0 - 10.5 K/uL   RBC 3.97  3.87 - 5.11 MIL/uL   Hemoglobin 11.8 (*) 12.0 - 15.0 g/dL   HCT 34.9 (*) 36.0 - 46.0 %   MCV 87.9  78.0 - 100.0 fL   MCH 29.7  26.0 - 34.0 pg   MCHC 33.8  30.0 - 36.0 g/dL   RDW 14.5  11.5 - 15.5 %   Platelets 184  150 - 400 K/uL  LACTATE DEHYDROGENASE     Status: Abnormal   Collection Time    07/10/14  5:38 AM       Result Value Ref Range   LDH 264 (*) 94 - 250 U/L  URIC ACID     Status: None   Collection Time    07/10/14  5:38 AM      Result Value Ref Range   Uric Acid, Serum 5.9  2.4 - 7.0 mg/dL  COMPREHENSIVE METABOLIC PANEL     Status: Abnormal   Collection Time    07/10/14  5:38 AM      Result Value Ref Range   Sodium 140  137 - 147 mEq/L   Potassium 3.6 (*) 3.7 - 5.3 mEq/L   Chloride 104  96 - 112 mEq/L   CO2 25  19 - 32 mEq/L   Glucose, Bld 93  70 - 99 mg/dL   BUN 11  6 - 23 mg/dL   Creatinine, Ser 0.73  0.50 - 1.10 mg/dL   Calcium 7.7 (*) 8.4 - 10.5 mg/dL   Total Protein 6.2  6.0 - 8.3 g/dL   Albumin 2.7 (*) 3.5 - 5.2 g/dL   AST 23  0 - 37 U/L   ALT 37 (*) 0 - 35 U/L   Alkaline Phosphatase 105  39 - 117 U/L   Total Bilirubin <0.2 (*) 0.3 - 1.2 mg/dL   GFR calc non Af Amer >90  >90 mL/min   GFR calc Af Amer >90  >90 mL/min   Anion gap 11  5 - 15     Assessment/Plan: Status post vaginal delivery day 8 PP pre-eclampsia Mild elevations of ALT and LDH, improved. Mild HA--likely muscle contraction type Stable Continue magnesium for 24 hours (started  at 1730) Will consult with Dr. Charlesetta Garibaldi regarding po anti-hypertensive options. Ice pack to neck Flexeril prn.   Allena Katz 07/10/2014, 8:48 AM

## 2014-07-10 NOTE — Plan of Care (Signed)
Problem: Phase I Progression Outcomes Goal: Pain controlled with appropriate interventions Outcome: Progressing With PO meds and non pharm interventions

## 2014-07-10 NOTE — Progress Notes (Signed)
UR chart review completed.  

## 2014-07-11 ENCOUNTER — Ambulatory Visit (HOSPITAL_COMMUNITY): Payer: PRIVATE HEALTH INSURANCE

## 2014-07-11 MED ORDER — LABETALOL HCL 100 MG PO TABS
100.0000 mg | ORAL_TABLET | Freq: Once | ORAL | Status: AC
  Administered 2014-07-11: 100 mg via ORAL
  Filled 2014-07-11: qty 1

## 2014-07-11 MED ORDER — LABETALOL HCL 200 MG PO TABS
200.0000 mg | ORAL_TABLET | Freq: Two times a day (BID) | ORAL | Status: DC
  Administered 2014-07-11 – 2014-07-12 (×2): 200 mg via ORAL
  Filled 2014-07-11 (×2): qty 1

## 2014-07-11 MED ORDER — ACETAMINOPHEN 325 MG PO TABS
650.0000 mg | ORAL_TABLET | Freq: Four times a day (QID) | ORAL | Status: DC | PRN
  Administered 2014-07-11 – 2014-07-12 (×3): 650 mg via ORAL
  Filled 2014-07-11 (×3): qty 2

## 2014-07-11 MED ORDER — NIFEDIPINE ER 30 MG PO TB24
30.0000 mg | ORAL_TABLET | Freq: Every day | ORAL | Status: DC
  Filled 2014-07-11 (×2): qty 1

## 2014-07-11 MED ORDER — SODIUM CHLORIDE 0.9 % IJ SOLN
3.0000 mL | Freq: Two times a day (BID) | INTRAMUSCULAR | Status: DC
  Administered 2014-07-12 – 2014-07-13 (×3): 3 mL via INTRAVENOUS

## 2014-07-11 MED ORDER — LABETALOL HCL 100 MG PO TABS
100.0000 mg | ORAL_TABLET | Freq: Two times a day (BID) | ORAL | Status: DC
  Administered 2014-07-11: 100 mg via ORAL
  Filled 2014-07-11: qty 1

## 2014-07-11 MED ORDER — LABETALOL HCL 200 MG PO TABS
200.0000 mg | ORAL_TABLET | Freq: Two times a day (BID) | ORAL | Status: DC
  Filled 2014-07-11 (×2): qty 1

## 2014-07-11 NOTE — Progress Notes (Addendum)
[name]    Subjective: Post Partum Day 9 SP Vaginal delivery, readmitted for postpartum pre exclampsia Patient up ad lib, denies syncope,HA or dizziness. States she feel much better and is ready to go home Reports consuming regular diet without issues and denies N/V No issues with urination and reports bleeding is appropriate  Right leg ok today Feeding:  Pumping, baby home with family Family in the room for support Contraceptive plan:   unsure  Objective: Temp:  [98.2 F (36.8 C)-99.9 F (37.7 C)] 98.9 F (37.2 C) (08/14 0810) Pulse Rate:  [52-81] 66 (08/14 0900) Resp:  [16-18] 16 (08/14 0900) BP: (137-179)/(68-90) 158/68 mmHg (08/14 0810) SpO2:  [94 %-100 %] 99 % (08/14 0900) Weight:  [206 lb 6.4 oz (93.622 kg)] 206 lb 6.4 oz (93.622 kg) (08/14 0600) Filed Vitals:   07/11/14 0655 07/11/14 0658 07/11/14 0810 07/11/14 0900  BP: 179/90 173/78 158/68   Pulse: 60 65 66 66  Temp:   98.9 F (37.2 C)   TempSrc:      Resp:   16 16  Height:      Weight:      SpO2: 94% 100% 100% 99%   Recent Results (from the past 2160 hour(s))  OB RESULTS CONSOLE GBS     Status: None   Collection Time    06/10/14 12:00 AM      Result Value Ref Range   GBS Positive    AMNISURE RUPTURE OF MEMBRANE (ROM)     Status: None   Collection Time    07/01/14 10:30 AM      Result Value Ref Range   Amnisure ROM POSITIVE    CBC     Status: Abnormal   Collection Time    07/01/14 11:35 AM      Result Value Ref Range   WBC 7.8  4.0 - 10.5 K/uL   RBC 3.95  3.87 - 5.11 MIL/uL   Hemoglobin 11.7 (*) 12.0 - 15.0 g/dL   HCT 34.8 (*) 36.0 - 46.0 %   MCV 88.1  78.0 - 100.0 fL   MCH 29.6  26.0 - 34.0 pg   MCHC 33.6  30.0 - 36.0 g/dL   RDW 15.0  11.5 - 15.5 %   Platelets 120 (*) 150 - 400 K/uL  RPR     Status: None   Collection Time    07/01/14 11:35 AM      Result Value Ref Range   RPR NON REAC  NON REAC   Comment: Performed at [facility]     Status: None    Collection Time    07/01/14 11:35 AM      Result Value Ref Range   Blood Bank Specimen SAMPLE AVAILABLE FOR TESTING     Sample Expiration 07/04/2014    TYPE AND SCREEN     Status: None   Collection Time    07/01/14 11:35 AM      Result Value Ref Range   ABO/RH(D) A POS     Antibody Screen NEG     Sample Expiration 07/04/2014    ABO/RH     Status: None   Collection Time    07/01/14 11:35 AM      Result Value Ref Range   ABO/RH(D) A POS    CBC     Status: Abnormal   Collection Time    07/02/14  5:42 AM      Result Value Ref Range  WBC 13.0 (*) 4.0 - 10.5 K/uL   RBC 3.59 (*) 3.87 - 5.11 MIL/uL   Hemoglobin 10.5 (*) 12.0 - 15.0 g/dL   HCT 31.6 (*) 36.0 - 46.0 %   MCV 88.0  78.0 - 100.0 fL   MCH 29.2  26.0 - 34.0 pg   MCHC 33.2  30.0 - 36.0 g/dL   RDW 15.1  11.5 - 15.5 %   Platelets 121 (*) 150 - 400 K/uL  CBC     Status: Abnormal   Collection Time    07/09/14  1:45 PM      Result Value Ref Range   WBC 6.1  4.0 - 10.5 K/uL   RBC 3.97  3.87 - 5.11 MIL/uL   Hemoglobin 11.7 (*) 12.0 - 15.0 g/dL   HCT 35.1 (*) 36.0 - 46.0 %   MCV 88.4  78.0 - 100.0 fL   MCH 29.5  26.0 - 34.0 pg   MCHC 33.3  30.0 - 36.0 g/dL   RDW 14.6  11.5 - 15.5 %   Platelets 183  150 - 400 K/uL  COMPREHENSIVE METABOLIC PANEL     Status: Abnormal   Collection Time    07/09/14  1:45 PM      Result Value Ref Range   Sodium 140  137 - 147 mEq/L   Potassium 4.2  3.7 - 5.3 mEq/L   Chloride 104  96 - 112 mEq/L   CO2 26  19 - 32 mEq/L   Glucose, Bld 80  70 - 99 mg/dL   BUN 12  6 - 23 mg/dL   Creatinine, Ser 0.77  0.50 - 1.10 mg/dL   Calcium 9.6  8.4 - 10.5 mg/dL   Total Protein 6.3  6.0 - 8.3 g/dL   Albumin 3.0 (*) 3.5 - 5.2 g/dL   AST 30  0 - 37 U/L   ALT 47 (*) 0 - 35 U/L   Alkaline Phosphatase 116  39 - 117 U/L   Total Bilirubin 0.3  0.3 - 1.2 mg/dL   GFR calc non Af Amer >90  >90 mL/min   GFR calc Af Amer >90  >90 mL/min   Comment: (NOTE)     The eGFR has been calculated using the CKD EPI  equation.     This calculation has not been validated in all clinical situations.     eGFR's persistently <90 mL/min signify possible Chronic Kidney     Disease.   Anion gap 10  5 - 15  LACTATE DEHYDROGENASE     Status: Abnormal   Collection Time    07/09/14  1:45 PM      Result Value Ref Range   LDH 291 (*) 94 - 250 U/L  URIC ACID     Status: None   Collection Time    07/09/14  1:45 PM      Result Value Ref Range   Uric Acid, Serum 6.0  2.4 - 7.0 mg/dL  PROTEIN / CREATININE RATIO, URINE     Status: None   Collection Time    07/09/14  2:00 PM      Result Value Ref Range   Creatinine, Urine 93.62     Total Protein, Urine 9.1     Comment: NO NORMAL RANGE ESTABLISHED FOR THIS TEST   PROTEIN CREATININE RATIO 0.10  0.00 - 0.15  MRSA PCR SCREENING     Status: Abnormal   Collection Time    07/09/14  5:50 PM  Result Value Ref Range   MRSA by PCR INVALID RESULTS, SPECIMEN SENT FOR CULTURE (*) NEGATIVE   Comment:            The GeneXpert MRSA Assay (FDA     approved for NASAL specimens     only), is one component of a     comprehensive MRSA colonization     surveillance program. It is not     intended to diagnose MRSA     infection nor to guide or     monitor treatment for     MRSA infections.     RESULT CALLED TO, READ BACK BY AND VERIFIED WITH:     C.MEAD AT 2027 ON 07/09/14 BY JPEEBLES  MRSA CULTURE     Status: None   Collection Time    07/09/14  5:50 PM      Result Value Ref Range   Specimen Description NASAL SWAB     Special Requests NONE     Culture       Value: NO SUSPICIOUS COLONIES, CONTINUING TO HOLD     Performed at [facility]   Report Status PENDING    MAGNESIUM     Status: Abnormal   Collection Time    07/09/14 11:26 PM      Result Value Ref Range   Magnesium 4.2 (*) 1.5 - 2.5 mg/dL  CBC     Status: Abnormal   Collection Time    07/10/14  5:38 AM      Result Value Ref Range   WBC 6.4  4.0 - 10.5 K/uL   RBC 3.97  3.87 - 5.11 MIL/uL    Hemoglobin 11.8 (*) 12.0 - 15.0 g/dL   HCT 34.9 (*) 36.0 - 46.0 %   MCV 87.9  78.0 - 100.0 fL   MCH 29.7  26.0 - 34.0 pg   MCHC 33.8  30.0 - 36.0 g/dL   RDW 14.5  11.5 - 15.5 %   Platelets 184  150 - 400 K/uL  LACTATE DEHYDROGENASE     Status: Abnormal   Collection Time    07/10/14  5:38 AM      Result Value Ref Range   LDH 264 (*) 94 - 250 U/L  URIC ACID     Status: None   Collection Time    07/10/14  5:38 AM      Result Value Ref Range   Uric Acid, Serum 5.9  2.4 - 7.0 mg/dL  COMPREHENSIVE METABOLIC PANEL     Status: Abnormal   Collection Time    07/10/14  5:38 AM      Result Value Ref Range   Sodium 140  137 - 147 mEq/L   Potassium 3.6 (*) 3.7 - 5.3 mEq/L   Chloride 104  96 - 112 mEq/L   CO2 25  19 - 32 mEq/L   Glucose, Bld 93  70 - 99 mg/dL   BUN 11  6 - 23 mg/dL   Creatinine, Ser 0.73  0.50 - 1.10 mg/dL   Calcium 7.7 (*) 8.4 - 10.5 mg/dL   Total Protein 6.2  6.0 - 8.3 g/dL   Albumin 2.7 (*) 3.5 - 5.2 g/dL   AST 23  0 - 37 U/L   ALT 37 (*) 0 - 35 U/L   Alkaline Phosphatase 105  39 - 117 U/L   Total Bilirubin <0.2 (*) 0.3 - 1.2 mg/dL   GFR calc non Af Amer >90  >90 mL/min   GFR calc  Af Amer >90  >90 mL/min   Comment: (NOTE)     The eGFR has been calculated using the CKD EPI equation.     This calculation has not been validated in all clinical situations.     eGFR's persistently <90 mL/min signify possible Chronic Kidney     Disease.   Anion gap 11  5 - 15    Physical Exam:  General: alert and cooperative Ext: WNL, No evidence of DVT seen on physical exam. +1 edema in LE Breast: Soft filling Lungs: CTAB Heart RRR without murmur Abdomen:  Soft, fundus firm, lochia scant, + bowel sounds, non distended, non tender Lochia: appropriate Uterine Fundus: firm Laceration: healing well I&O +603.4 from over night   Recent Labs  07/09/14 1345 07/10/14 0538  HGB 11.7* 11.8*  HCT 35.1* 34.9*    Assessment S/P Vaginal Delivery-Day 9 SP magnesium, DC'd 15  hours +1 edema, Neg Homan Stable  Normal Involution Breast pumping Circumcision:  Plan: Continue current care Closely monitor BP  Dr. Raphael Gibney and Elijah Phommachanh updated on patient status  Possible discharge for today Lactation support if request IV labetalol per hypertensive protocol PO labetalol per schedule    [name], CNM, MSN 07/11/2014, 10:26 AM   Will try procardia XL 76m and d/c labetalol.  Good UOP.  Pt reports nagging HA in back of head and neck. ? Tension HA but may be related to BPs.  Will switch BP med and cont to observe.

## 2014-07-11 NOTE — Progress Notes (Signed)
07/11/14 2310  Vitals  BP ! 190/84 mmHg  MAP (mmHg) 111  BP Location Left arm  BP Method Automatic  Patient Position (if appropriate) Lying  Pulse Rate 65  Oxygen Therapy  SpO2 100 %  Labetalol 20 mg IV administered as ordered.

## 2014-07-11 NOTE — Progress Notes (Signed)
07/11/14 2200  Vitals  BP ! 174/81 mmHg  MAP (mmHg) 100  BP Location Left arm  BP Method Automatic  Patient Position (if appropriate) Lying  Pulse Rate 67  Oxygen Therapy  SpO2 99 %  O2 Device None (Room air)  Scheduled Labetalol 200 mg administered as ordered. We will continue to monitor VS q2 hrs

## 2014-07-11 NOTE — Progress Notes (Signed)
07/11/14 2055  Vitals  BP ! 178/90 mmHg  MAP (mmHg) 111  BP Location Left arm  BP Method Automatic  Patient Position (if appropriate) Lying  Pulse Rate 68  Oxygen Therapy  SpO2 100 %  Labetalol 20 mg IV prn administered as ordered. We will continue to monitor.

## 2014-07-11 NOTE — Progress Notes (Signed)
07/11/14 2010  Vitals  Temp 98.8 F (37.1 C)  Temp src Oral  BP ! 174/85 mmHg  MAP (mmHg) 109  BP Location Left arm  BP Method Automatic  Patient Position (if appropriate) Sitting  Pulse Rate 71  Resp 18  Oxygen Therapy  SpO2 99 %  O2 Device None (Room air)   Patient tearful, baby sent home with grandmother. Much support offered and we will recheck B/P when pt is settled.

## 2014-07-11 NOTE — Progress Notes (Addendum)
BP 140-175/ 60-80 s/p discontinuing Magnesium Sulfate, despite IV Labetalol. Spoke w/ Dr. Raphael Gibney. Will start Labetalol 100 mg po bid.   Farrel Gordon, CNM, MS 07/11/14 @ 7 AM

## 2014-07-11 NOTE — Lactation Note (Signed)
Lactation Consultation Note  Patient Name: [name] [id number] Date: 07/11/2014   Visited with Mom while in [place].  Mom admitted for 24 hrs of MgSO4 for hypertension.  Mom hoping to go home today or tomorrow, if BP can be lowered on oral antihypertensive.  Mom delivered one week ago, and had been pumping and breast feeding fine.  She has been pumping every 3 hrs while in hospital with DEBP at bedside.  No problems or questions at this point.  Encouraged her to ask her RN to page [facility] if any questions.     Broadus John 07/11/2014, 12:49 PM

## 2014-07-12 LAB — MRSA CULTURE

## 2014-07-12 MED ORDER — DIBUCAINE 1 % RE OINT
TOPICAL_OINTMENT | RECTAL | Status: DC | PRN
  Administered 2014-07-12: 12:00:00 via RECTAL
  Filled 2014-07-12: qty 28

## 2014-07-12 MED ORDER — LABETALOL HCL 300 MG PO TABS
300.0000 mg | ORAL_TABLET | Freq: Three times a day (TID) | ORAL | Status: DC
  Administered 2014-07-12: 300 mg via ORAL
  Filled 2014-07-12 (×4): qty 1

## 2014-07-12 MED ORDER — HYDRALAZINE HCL 20 MG/ML IJ SOLN
10.0000 mg | Freq: Once | INTRAMUSCULAR | Status: AC
  Administered 2014-07-12: 10 mg via INTRAVENOUS
  Filled 2014-07-12: qty 1

## 2014-07-12 MED ORDER — LABETALOL HCL 200 MG PO TABS
400.0000 mg | ORAL_TABLET | Freq: Three times a day (TID) | ORAL | Status: DC
  Administered 2014-07-13 (×2): 400 mg via ORAL
  Filled 2014-07-12 (×2): qty 2

## 2014-07-12 MED ORDER — DOCUSATE SODIUM 100 MG PO CAPS
100.0000 mg | ORAL_CAPSULE | Freq: Two times a day (BID) | ORAL | Status: DC
  Administered 2014-07-12 – 2014-07-13 (×2): 100 mg via ORAL
  Filled 2014-07-12 (×2): qty 1

## 2014-07-12 MED ORDER — POLYETHYLENE GLYCOL 3350 17 G PO PACK
17.0000 g | PACK | Freq: Every day | ORAL | Status: DC
  Administered 2014-07-13: 17 g via ORAL
  Filled 2014-07-12 (×3): qty 1

## 2014-07-12 MED ORDER — HYDROCORTISONE ACE-PRAMOXINE 1-1 % RE FOAM
1.0000 | Freq: Two times a day (BID) | RECTAL | Status: DC
  Administered 2014-07-12 – 2014-07-13 (×3): 1 via RECTAL
  Filled 2014-07-12: qty 10

## 2014-07-12 MED ORDER — LABETALOL HCL 300 MG PO TABS
300.0000 mg | ORAL_TABLET | Freq: Three times a day (TID) | ORAL | Status: DC
  Filled 2014-07-12 (×2): qty 1

## 2014-07-12 NOTE — Progress Notes (Signed)
07/12/14 0000  Vitals  BP ! 173/83 mmHg  MAP (mmHg) 106  IV access now achieved, Labetalol 40 mg IV PRN administered.

## 2014-07-12 NOTE — Progress Notes (Signed)
[name]  Postpartum Day 10 LOS: 3  Subjective Call from nurse regarding increased b/p and HA.  Patient reports HA 5/10 prior to percocet and currently 1/10.  Patient reports pain radiates from back of neck to posterior aspect of head and shoulders.  Patient expressed concern regarding elevated B/P.   Objective  Filed Vitals:   07/12/14 1200 07/12/14 1400 07/12/14 1809 07/12/14 2152  BP: 155/82 156/88 143/86 184/94  Pulse: 71 70 80 64  Temp: 98.4 F (36.9 C) 98.7 F (37.1 C) 98.4 F (36.9 C) 99.1 F (37.3 C)  TempSrc: Oral Oral Oral Oral  Resp: 18 18 18 18   Height:      Weight:      SpO2: 99% 99% 99% 100%    Hemoglobin  Date/Time Value Ref Range Status  07/10/2014  5:38 AM 11.8* 12.0 - 15.0 g/dL Final  07/09/2014  1:45 PM 11.7* 12.0 - 15.0 g/dL Final     HCT  Date/Time Value Ref Range Status  07/10/2014  5:38 AM 34.9* 36.0 - 46.0 % Final  07/09/2014  1:45 PM 35.1* 36.0 - 46.0 % Final     Platelets  Date/Time Value Ref Range Status  07/10/2014  5:38 AM 184  150 - 400 K/uL Final  07/09/2014  1:45 PM 183  150 - 400 K/uL Final    PE:  General: No Distress Noted Lungs: CTA, Some Crackles heard over bronchi Heart: Regular Rate & Rhythm GI: BS x4, Abdomen Soft NT Extremeties: No edema, BUE-+3 DTRs, BLE-+1 DTRs  Assessment Postpartum Day 10 S/P Vaginal Delivery PP Preeclampsia-Mild Hypertension  Plan Dr. Florene Route consulted and advised as below: Assess patient status Give IV Labetalol per Protocol Increase current dosage of PO Labetalol from 300mg  to 400mg  BID to start at 0100 Patient informed of POC and reassurances given regarding concern of HTN No other questions or concerns Nurses informed to call in house provider for all updates BP Q hour x 4--report any b/p outside of parameters Continue other mgmt as ordered  Layci Stenglein LYNN, CNM 07/12/2014, 10:57 PM

## 2014-07-12 NOTE — Progress Notes (Signed)
07/12/14 0200  Vitals  BP 133/67 mmHg  MAP (mmHg) 80  BP Location Left arm  BP Method Automatic  Patient Position (if appropriate) Lying  Pulse Rate 66  Oxygen Therapy  SpO2 100 %  O2 Device None (Room air)

## 2014-07-12 NOTE — Progress Notes (Signed)
RN reports pt with 3 hemorrhoids causing pain. In to assess.  Pt on side in no acute distress. Hemorrhoids x 3, 1-2 cm each.  Ointment well applied.  BPs 140s-150s/60-80s Changed Labetalol 300 mg TID to q8 hours.    Anticipate discharge home in am. Add stool softener if not already ordered.

## 2014-07-12 NOTE — Progress Notes (Signed)
Postpartum day 10 Postpartum Preeclampsia.  Reviewed CNM note.   Vital signs and labs reviewed.  Pt received labetalol IV 5 times overnight for severe range blood pressure.  195/88, 174/93 highest Was on Labetalol 200 mg BID.  Gen:  NAD Lungs:  CTA bilaterally Neuro:  DTR 2+  Postpartum preeclampsia with uncontrolled BP. Increase Labetalol to 300 mg TID. Observe BP q 4 hours, especially overnight. If BP controlled, discharge in am. Discussed with CNM, Standard.

## 2014-07-12 NOTE — Progress Notes (Signed)
07/12/14 0545  Vitals  BP ! 156/73 mmHg  MAP (mmHg) 94  BP Location Left arm  BP Method Automatic  Patient Position (if appropriate) Lying  Pulse Rate 78  Resp 18  Oxygen Therapy  SpO2 100 %  O2 Device None (Room air)  We will continue to monitor q 1 hr

## 2014-07-12 NOTE — Progress Notes (Signed)
07/12/14 0500  Vitals  BP ! 163/77 mmHg  MAP (mmHg) 100  BP Location Left arm  BP Method Automatic  Patient Position (if appropriate) Lying  Pulse Rate 78  Oxygen Therapy  SpO2 99 %  O2 Device None (Room air)  Labetalol 20 mg IV prn administered. We will repeat B/P @0530  AM

## 2014-07-12 NOTE — Progress Notes (Signed)
07/12/14 0045  Vitals  BP ! 174/93 mmHg (CNM call and updated)  MAP (mmHg) 112  BP Location Left arm  BP Method Automatic  Patient Position (if appropriate) Lying  Pulse Rate 66  Oxygen Therapy  SpO2 100 %  O2 Device None (Room air)  CNM informed of elevated B/P. New order received for Hydralazine 10 mg IV x 1

## 2014-07-12 NOTE — Progress Notes (Signed)
[name]    Subjective: Post Partum Day 10 SP Vaginal delivery, readmitted for postpartum pre exclampsia  Patient up ad lib, denies syncope,HA or dizziness.  Reports consuming regular diet without issues and denies N/V  No issues with urination and reports bleeding is appropriate  Right side neck pain, holding ice pack on it Feeding: Pumping, baby home with family  Family in the room for support  Contraceptive plan: unsure   Objective: Temp:  [98.3 F (36.8 C)-98.8 F (37.1 C)] 98.3 F (36.8 C) (08/15 0830) Pulse Rate:  [58-87] 74 (08/15 0800) Resp:  [16-18] 16 (08/15 0800) BP: (133-195)/(67-93) 150/76 mmHg (08/15 0800) SpO2:  [97 %-100 %] 99 % (08/15 0800) Weight:  [212 lb 6.4 oz (96.344 kg)] 212 lb 6.4 oz (96.344 kg) (08/15 0600)  Filed Vitals:   07/12/14 0600 07/12/14 0700 07/12/14 0800 07/12/14 0830  BP: 158/89 165/87 150/76   Pulse: 87 79 74   Temp: 98.6 F (37 C)   98.3 F (36.8 C)  TempSrc: Oral  Oral Oral  Resp:   16   Height:      Weight: 212 lb 6.4 oz (96.344 kg)     SpO2: 97% 100% 99%    Physical Exam:  General: alert and cooperative Ext: WNL, no edema. No evidence of DVT seen on physical exam. Breast: Soft filling Abdomen:  Soft, fundus firm, lochia scant, + bowel sounds, non distended, non tender Lochia: appropriate Uterine Fundus: firm Laceration: healing well    Recent Labs  07/09/14 1345 07/10/14 0538  HGB 11.7* 11.8*  HCT 35.1* 34.9*    Assessment S/P Vaginal Delivery-Day 10 SP magnesium, DC'd 39 hours IV hypertensives given x5  +1 edema, Neg Homan  Stable  Normal Involution  Breast pumping      Plan: Continue current care  Closely monitor BP  Possible discharge for today  Lactation support if request  IV labetalol per hypertensive protocol  PO labetalol per schedule     Myldred Raju, CNM, MSN 07/12/2014, 9:20 AM

## 2014-07-13 MED ORDER — NIFEDIPINE ER 60 MG PO TB24
60.0000 mg | ORAL_TABLET | Freq: Once | ORAL | Status: AC
  Administered 2014-07-13: 60 mg via ORAL
  Filled 2014-07-13: qty 1

## 2014-07-13 MED ORDER — NIFEDIPINE ER OSMOTIC RELEASE 60 MG PO TB24: 60.0000 mg | ORAL_TABLET | Freq: Every day | ORAL | Status: DC

## 2014-07-13 MED ORDER — FUROSEMIDE 40 MG PO TABS
40.0000 mg | ORAL_TABLET | Freq: Once | ORAL | Status: AC
  Administered 2014-07-13: 40 mg via ORAL
  Filled 2014-07-13: qty 1

## 2014-07-13 NOTE — Discharge Summary (Signed)
Physician Discharge Summary  Patient ID: [name] MRN: [id number] DOB/AGE: 1976/05/09 38 y.o.  Admit date: 07/09/2014 Discharge date: 07/13/2014  Admission Diagnoses: 7 days s/p SVB, pp pre-eclampsia  Discharge Diagnoses:  Active Problems:   Postpartum hypertension--hx after last delivery   Pre-eclampsia, mild, postpartum   Discharged Condition: stable  Hospital Course: Admitted 07/04/14 with elevated BP, HA, and swelling.  BP elevated on exam, reflexes brisk with clonus, occipital HA noted, mild elevation of LFTs.  PCR WNL.  Hx of pp pre-eclampsia after last delivery.  Admitted for magnesium sulfate.  She continued to pump for breastmilk.  BP readings required IV anti-hypertensives.  Placed on Labetalol po, with several increases in dose without significant improvement in BP readings.  Lasix 40 mg po x 1 administered, and antihypertensive med changed to Procardia 60 mg XL on 8/16, with improvement in BP readings.  Patient still had sporadic HA, which was treated with Motrin and Percocet with benefit.  She was d/c'd home on 8/16 to continue Procardia 60 mg XL daily.  Patient staying with family in [place] at present--her sister-in-law is an Therapist, sports and will check patient's BP on Tuesday--she will notify us if systolic > or = 536 or diastolic > or = 90, and we will plan office f/u in 1 week.  BP precautions reviewed with patient.  Consults: None  Significant Diagnostic Studies:   PIH labs and PCR  Treatments: IV hydration and Magnesium sulfate, IV anti-hypertensives, po anti-hypertensives  Discharge Exam: Blood pressure 124/69, pulse 94, temperature 98.2 F (36.8 C), temperature source Oral, resp. rate 17, height 5\' 6"  (1.676 m), weight 206 lb (93.441 kg), SpO2 98.00%, unknown if currently breastfeeding.  Filed Vitals:   07/13/14 1302 07/13/14 1401 07/13/14 1505 07/13/14 1601  BP: 138/80 140/78 128/72 124/69  Pulse: 78 82 94 94  Temp: 98.2 F (36.8 C) 98.5 F (36.9 C) 98.7 F  (37.1 C) 98.2 F (36.8 C)  TempSrc: Oral Oral Oral Oral  Resp: 16 17 16 17   Height:      Weight:      SpO2: 98% 99% 98% 98%    General appearance: alert Breasts: normal appearance, no masses or tenderness Cardio: regular rate and rhythm, S1, S2 normal, no murmur, click, rub or gallop GI: soft, non-tender; bowel sounds normal; no masses,  no organomegaly Extremities: extremities normal, atraumatic, no cyanosis or edema Uterus--well-involuted, NT Lochia--small amount  Disposition: 01-Home or Self Care  Discharge Instructions   Nursing communication    Complete by:  As directed   Please notify [facility] RN to see patient on Tuesday of this week and call BP results to [facility] at [phone].            Medication List    STOP taking these medications       valACYclovir 500 MG tablet  Commonly known as:  VALTREX      TAKE these medications       cholecalciferol 1000 UNITS tablet  Commonly known as:  VITAMIN D  Take 1,000 Units by mouth daily.     ibuprofen 600 MG tablet  Commonly known as:  ADVIL,MOTRIN  Take 1 tablet (600 mg total) by mouth every 6 (six) hours as needed.     MAGNESIA PO  Take 1 tablet by mouth daily.     NIFEdipine 60 MG 24 hr tablet  Commonly known as:  PROCARDIA XL  Take 1 tablet (60 mg total) by mouth daily.  Start taking on:  07/14/2014  oxyCODONE-acetaminophen 5-325 MG per tablet  Commonly known as:  PERCOCET/ROXICET  Take 1-2 tablets by mouth every 4 (four) hours as needed for severe pain (moderate - severe pain).     prenatal multivitamin Tabs tablet  Take 1 tablet by mouth daily at 12 noon.           Follow-up Information   Follow up with [facility]. English as a second language teacher [employer] nurse will see you this week, and the our office will call you to schedule at f/u visit by the end of the week at [facility].)    Specialty:  Obstetrics and Gynecology   Contact information:   [address]  [place]  [id number] [phone]      Signed: Donnel Saxon 07/13/2014, 4:58 PM

## 2014-07-13 NOTE — Progress Notes (Signed)
Discharge instructions provided to patient and significant other at bedside.  Medications, activity, when to call the doctor, follow up appointments and community resources discussed.  No questions at this time.  Patient left unit in stable condition with all personal belongings accompanied by staff.  Leighton Roach, RN----------------------

## 2014-07-13 NOTE — Progress Notes (Signed)
Hospital day # 4--PP day 11, pp pre-eclampsia, s/p SVB  S:  HA this am--"I know my BP is elevated when I have a HA."  Denies visual        sx or epigastric pain.  Having gas pain on left side, no BM in 2-3 days.        Denies N/V.  Occasional flatus.  Painful hemorrhoids.  Pumping for breast      Feeding, good production.  Baby remaining at home with patient's parents.       O: BP 189/86  Pulse 66  Temp(Src) 99 F (37.2 C) (Oral)  Resp 16  Ht 5\' 6"  (1.676 m)  Wt 206 lb (93.441 kg)  BMI 33.27 kg/m2  SpO2 100%  Admission weight 207 (230 on day of admission for labor) 207 on 8/12 208 on 8/13 206 on 8/14 212 on 8/15  Filed Vitals:   07/13/14 0200 07/13/14 0638 07/13/14 0802 07/13/14 0900  BP: 157/71 160/82 170/78 189/86  Pulse: 66 76 66 66  Temp: 98.7 F (37.1 C) 98.6 F (37 C) 98.5 F (36.9 C) 99 F (37.2 C)  TempSrc: Oral Oral Oral Oral  Resp: 16 16 17 16   Height:      Weight:  206 lb (93.441 kg)    SpO2: 99% 98% 98% 100%   Received Labetalol 400 mg at 0815 No IV anti-hypertensives given during night.       Chest clear      Heart RRR without murmur      Abd--soft, NT, mild gaseous distension, + bowel sounds all quadrants        Uterus Well-involuted and non-tender      Lochia small      Extremities: DTR 1-2+, no clonus, trace edema.  Negative Homan's, no s/x DVT.          Labs:  None since 8/13  I/O balance:  -1290 last 24 hours. Output 950 cc since 7am.       Meds:  . docusate sodium  100 mg Oral BID  . hydrocortisone-pramoxine  1 applicator Rectal BID  . polyethylene glycol  17 g Oral Daily  . prenatal multivitamin  1 tablet Oral Q1200  . sodium chloride  3 mL Intravenous Q12H    A: 11 days s/p SVB      PP pre-eclampsia--received 24 hours of Magnesium      BP refractory to po Labetalol  P: Consulted with Dr. Simona Huh      D/C labetalol      Procardia XL 60 mg now      Lasix 40 mg po now.      MDs will follow--Dr. Simona Huh will see later today.  Support to patient for continued hospitalization at present--discussed plan        of care and changes in meds.      Proctofoam HC      Encouraged bowel regimen with Miralax, prune/apple juice cocktail.        Donnel Saxon CNM, [place] 07/13/2014 9:34 AM

## 2014-07-13 NOTE — Progress Notes (Signed)
Postpartum day 12 Postpartum preeclampsia.  Reviewed CNM note.  Overnight, BP went up to 184/94.  Pt had a headache.  Given Percocet x 2 and IV labetalol.  This am at 0900 BP was 189/86, Procardia XL 30 mg added to Labetalol 400 mg TID and pt given Lasix 40 mg po once.  Pt states she has a mild headache.  Recent BP was 138/80.  Pain from hemorrhoids was significantly relieved with Percocet.  Doing sitz baths.  Denies visual changes.  BP responding well to addition of Procardia.  Suspect headache may be due to Procardia or lower normal BP.  Continue to observe BP.  If remains normal, discharge this evening.

## 2014-07-13 NOTE — Discharge Instructions (Signed)

## 2014-07-13 NOTE — Progress Notes (Deleted)
  Subjective: Breathing with contractions now.  Walking and sitting on [employer].  Objective: BP 124/69  Pulse 94  Temp(Src) 98.2 F (36.8 C) (Oral)  Resp 17  Ht 5\' 6"  (1.676 m)  Wt 206 lb (93.441 kg)  BMI 33.27 kg/m2  SpO2 98% I/O last 3 completed shifts: In: 480 [P.O.:480] Out: 3050 [Urine:3050] Total I/O In: 1200 [P.O.:1200] Out: 2550 [Urine:2550]  FHT: Category 1 UC:   regular, every 3-4 minutes SVE:    3 cm, 80%, vtx, -1 AROM--clear fluid Pitocin at 20 mu/min  Assessment:  Induction of labor Early labor  Plan: Continue pitocin induction.  Donnel Saxon CNM 07/13/2014, 4:08 PM

## 2014-09-29 ENCOUNTER — Encounter (HOSPITAL_COMMUNITY): Payer: Self-pay | Admitting: Obstetrics and Gynecology

## 2016-02-02 ENCOUNTER — Other Ambulatory Visit: Payer: Self-pay | Admitting: Registered Nurse

## 2016-02-02 ENCOUNTER — Other Ambulatory Visit (HOSPITAL_COMMUNITY)
Admission: RE | Admit: 2016-02-02 | Discharge: 2016-02-02 | Disposition: A | Discharge status: Home / Self Care | Payer: Managed Care, Other (non HMO) | Source: Ambulatory Visit | Attending: Internal Medicine | Admitting: Internal Medicine

## 2016-02-02 DIAGNOSIS — Z01419 Encounter for gynecological examination (general) (routine) without abnormal findings: Secondary | ICD-10-CM | POA: Insufficient documentation

## 2016-02-08 LAB — CYTOLOGY - PAP

## 2017-08-04 ENCOUNTER — Other Ambulatory Visit: Payer: Self-pay | Admitting: Internal Medicine

## 2017-08-04 DIAGNOSIS — Z1231 Encounter for screening mammogram for malignant neoplasm of breast: Secondary | ICD-10-CM

## 2017-08-18 ENCOUNTER — Ambulatory Visit
Admission: RE | Admit: 2017-08-18 | Discharge: 2017-08-18 | Disposition: A | Discharge status: Home / Self Care | Payer: PRIVATE HEALTH INSURANCE | Source: Ambulatory Visit | Attending: Internal Medicine | Admitting: Internal Medicine

## 2017-08-18 DIAGNOSIS — Z1231 Encounter for screening mammogram for malignant neoplasm of breast: Secondary | ICD-10-CM

## 2018-03-02 DIAGNOSIS — R011 Cardiac murmur, unspecified: Secondary | ICD-10-CM | POA: Insufficient documentation

## 2019-07-23 DIAGNOSIS — R001 Bradycardia, unspecified: Secondary | ICD-10-CM | POA: Insufficient documentation

## 2020-10-26 ENCOUNTER — Other Ambulatory Visit: Payer: PRIVATE HEALTH INSURANCE

## 2023-01-09 DIAGNOSIS — E7849 Other hyperlipidemia: Secondary | ICD-10-CM | POA: Insufficient documentation

## 2023-04-04 DIAGNOSIS — K5904 Chronic idiopathic constipation: Secondary | ICD-10-CM | POA: Insufficient documentation

## 2023-04-04 DIAGNOSIS — R319 Hematuria, unspecified: Secondary | ICD-10-CM | POA: Insufficient documentation

## 2023-04-04 DIAGNOSIS — N390 Urinary tract infection, site not specified: Secondary | ICD-10-CM | POA: Insufficient documentation

## 2023-04-04 DIAGNOSIS — N12 Tubulo-interstitial nephritis, not specified as acute or chronic: Secondary | ICD-10-CM | POA: Insufficient documentation

## 2023-04-04 DIAGNOSIS — R140 Abdominal distension (gaseous): Secondary | ICD-10-CM | POA: Insufficient documentation

## 2023-04-04 DIAGNOSIS — L732 Hidradenitis suppurativa: Secondary | ICD-10-CM | POA: Insufficient documentation

## 2023-04-04 DIAGNOSIS — E669 Obesity, unspecified: Secondary | ICD-10-CM | POA: Insufficient documentation

## 2024-01-12 DIAGNOSIS — R002 Palpitations: Secondary | ICD-10-CM | POA: Insufficient documentation

## 2024-09-03 DIAGNOSIS — R9389 Abnormal findings on diagnostic imaging of other specified body structures: Secondary | ICD-10-CM | POA: Insufficient documentation

## 2024-09-19 DIAGNOSIS — M6289 Other specified disorders of muscle: Secondary | ICD-10-CM | POA: Insufficient documentation

## 2024-10-14 DIAGNOSIS — R9431 Abnormal electrocardiogram [ECG] [EKG]: Secondary | ICD-10-CM | POA: Insufficient documentation

## 2024-10-14 DIAGNOSIS — Z8249 Family history of ischemic heart disease and other diseases of the circulatory system: Secondary | ICD-10-CM | POA: Insufficient documentation

## 2024-11-28 ENCOUNTER — Emergency Department (HOSPITAL_COMMUNITY)
Admission: EM | Admit: 2024-11-28 | Discharge: 2024-11-28 | Disposition: A | Discharge status: Home / Self Care | Source: Home / Self Care | Attending: Emergency Medicine | Admitting: Emergency Medicine

## 2024-11-28 ENCOUNTER — Other Ambulatory Visit: Payer: Self-pay

## 2024-11-28 ENCOUNTER — Encounter (HOSPITAL_COMMUNITY): Payer: Self-pay

## 2024-11-28 DIAGNOSIS — K644 Residual hemorrhoidal skin tags: Secondary | ICD-10-CM | POA: Diagnosis not present

## 2024-11-28 DIAGNOSIS — K5901 Slow transit constipation: Secondary | ICD-10-CM | POA: Diagnosis not present

## 2024-11-28 DIAGNOSIS — K5900 Constipation, unspecified: Secondary | ICD-10-CM | POA: Diagnosis present

## 2024-11-28 DIAGNOSIS — O200 Threatened abortion: Secondary | ICD-10-CM | POA: Insufficient documentation

## 2024-11-28 NOTE — ED Notes (Signed)
Pt given a Sprite to drink

## 2024-11-28 NOTE — ED Provider Notes (Signed)
 " [place] EMERGENCY DEPARTMENT AT [facility] Provider Note   CSN: [id number] Arrival date & time: 11/28/24  0102     Patient presents with: Constipation   [name] is a 49 y.o. female.    Constipation Associated symptoms: nausea   Associated symptoms: no abdominal pain, no fever and no vomiting   Patient with distant history of small bowel obstruction that resolved spontaneously, previous cholecystectomy, hypertension that is managed with amlodipine presents with constipation for 3 days.  Patient reports rectal pain and pressure and unable to push anymore.  No fevers or vomiting but she does report nausea.  She denies any abdominal pain or back pain She has been tried multiple modalities including stool softener, tea and enemas without any results. She does not take chronic opiates.  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 Authorizing Provider  cholecalciferol (VITAMIN D) 1000 UNITS tablet Take 1,000 Units by mouth daily.    [provider]    Allergies: Patient has no known allergies.    Review of Systems  Constitutional:  Negative for fever.  Gastrointestinal:  Positive for constipation, nausea and rectal  pain. Negative for abdominal pain, blood in stool and vomiting.    Updated Vital Signs BP 139/70 (BP Location: Left Arm)   Pulse 81   Temp 98.1 F (36.7 C) (Oral)   Resp 20   Ht 1.676 m (5' 6)   Wt 74.8 kg   LMP 01/25/2012   SpO2 99%   BMI 26.63 kg/m   Physical Exam CONSTITUTIONAL: Well developed/well nourished HEAD: Normocephalic/atraumatic ENMT: Mucous membranes moist NECK: supple no meningeal signs CV: S1/S2 noted, no murmurs/rubs/gallops noted LUNGS: Lungs are clear to auscultation bilaterally, no apparent distress ABDOMEN: soft, nontender, no rebound or guarding, bowel sounds noted throughout abdomen Rectal-stool impaction noted, no rectal abscess or mass, small external nonthrombosed hemorrhoids.  Stool is brown without melena or blood Chaperoned by nurse Ileana NEURO: Pt is awake/alert/appropriate, moves all extremitiesx4.  No facial droop.    (all labs ordered are listed, but only abnormal results are displayed) Labs Reviewed - No data to display  EKG: None  Radiology: No results found.   .Fecal disimpaction  Date/Time: 11/28/2024 2:10 AM  Performed by: Midge Golas, MD Authorized by: Midge Golas, MD  Consent: Verbal consent obtained Local anesthesia used: no  Anesthesia: Local anesthesia used: no  Sedation: Patient sedated: no  Patient tolerance: patient tolerated the procedure well with no immediate complications Comments: Bedside fecal disimpaction was performed with nurse present Large amount of brown stool was disimpacted.  No immediate complications, patient tolerated well      Medications Ordered in  the ED - No data to display  Clinical Course as of 11/28/24 0255  Thu Nov 28, 2024  0253 Patient presents with constipation for the past 3 days unresponsive to home remedies She has no abdominal pain or vomiting. After fecal disimpaction, patient feels much improved. She was able to go to the bathroom on her own and continue having a  bowel movement She denies any abdominal pain [DW]  0254 At this point patient will be discharged home.  She is well-appearing.  We discussed strict return precautions.  I have low suspicion for bowel obstruction We discussed appropriate use of fiber in her diet  [DW]    Clinical Course User Index [DW] Midge Golas, MD                                 Medical Decision Making       Final diagnoses:  Slow transit constipation    ED Discharge Orders     None          Midge Golas, MD 11/28/24 9744  "

## 2024-11-28 NOTE — ED Triage Notes (Addendum)
 Pt here with c/o constipation x 3 days. States has tried stool softener, laxative tea, and 2 enemas  without any results. States she tried all above hours ago. Pt denies any abdominal pain but endorses nausea.
